# Patient Record
Sex: Female | Born: 1985 | Race: Black or African American | Hispanic: No | Marital: Married | Smoking: Current every day smoker
Health system: Southern US, Community
[De-identification: ages and names within clinical notes are randomized; demographics above are authoritative.]

## PROBLEM LIST (undated history)

## (undated) DIAGNOSIS — J189 Pneumonia, unspecified organism: Secondary | ICD-10-CM

## (undated) DIAGNOSIS — J45909 Unspecified asthma, uncomplicated: Secondary | ICD-10-CM

## (undated) DIAGNOSIS — Z9289 Personal history of other medical treatment: Secondary | ICD-10-CM

## (undated) DIAGNOSIS — D649 Anemia, unspecified: Secondary | ICD-10-CM

## (undated) HISTORY — PX: CHOLECYSTECTOMY: SHX55

## (undated) HISTORY — DX: Anemia, unspecified: D64.9

## (undated) HISTORY — PX: TONSILLECTOMY: SUR1361

---
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
## 2014-06-05 ENCOUNTER — Emergency Department (HOSPITAL_BASED_OUTPATIENT_CLINIC_OR_DEPARTMENT_OTHER): Payer: Self-pay

## 2014-06-05 ENCOUNTER — Inpatient Hospital Stay (HOSPITAL_BASED_OUTPATIENT_CLINIC_OR_DEPARTMENT_OTHER)
Admission: EM | Admit: 2014-06-05 | Discharge: 2014-06-08 | DRG: 193 | Disposition: A | Discharge status: Home / Self Care | Payer: Self-pay | Attending: Internal Medicine | Admitting: Internal Medicine

## 2014-06-05 ENCOUNTER — Encounter (HOSPITAL_BASED_OUTPATIENT_CLINIC_OR_DEPARTMENT_OTHER): Payer: Self-pay | Admitting: *Deleted

## 2014-06-05 DIAGNOSIS — R059 Cough, unspecified: Secondary | ICD-10-CM

## 2014-06-05 DIAGNOSIS — Z885 Allergy status to narcotic agent status: Secondary | ICD-10-CM

## 2014-06-05 DIAGNOSIS — J4520 Mild intermittent asthma, uncomplicated: Secondary | ICD-10-CM

## 2014-06-05 DIAGNOSIS — J189 Pneumonia, unspecified organism: Principal | ICD-10-CM | POA: Diagnosis present

## 2014-06-05 DIAGNOSIS — R0602 Shortness of breath: Secondary | ICD-10-CM

## 2014-06-05 DIAGNOSIS — R197 Diarrhea, unspecified: Secondary | ICD-10-CM | POA: Diagnosis present

## 2014-06-05 DIAGNOSIS — R05 Cough: Secondary | ICD-10-CM

## 2014-06-05 DIAGNOSIS — J9601 Acute respiratory failure with hypoxia: Secondary | ICD-10-CM | POA: Diagnosis present

## 2014-06-05 DIAGNOSIS — J45909 Unspecified asthma, uncomplicated: Secondary | ICD-10-CM | POA: Diagnosis present

## 2014-06-05 DIAGNOSIS — D509 Iron deficiency anemia, unspecified: Secondary | ICD-10-CM | POA: Diagnosis present

## 2014-06-05 DIAGNOSIS — Z6841 Body Mass Index (BMI) 40.0 and over, adult: Secondary | ICD-10-CM

## 2014-06-05 DIAGNOSIS — Z881 Allergy status to other antibiotic agents status: Secondary | ICD-10-CM

## 2014-06-05 HISTORY — DX: Unspecified asthma, uncomplicated: J45.909

## 2014-06-05 LAB — CREATININE, SERUM
Creatinine, Ser: 0.58 mg/dL (ref 0.50–1.10)
GFR calc Af Amer: >90 mL/min (ref >90)

## 2014-06-05 LAB — CBC WITH DIFFERENTIAL/PLATELET
BASOS ABS: 0.1 10*3/uL (ref 0.0–0.1)
Basophils Relative: 1 % (ref 0–1)
Eosinophils Absolute: 0 10*3/uL (ref 0.0–0.7)
Eosinophils Relative: 0 % (ref 0–5)
HCT: 30.6 % — ABNORMAL LOW (ref 36.0–46.0)
Hemoglobin: 8.2 g/dL — ABNORMAL LOW (ref 12.0–15.0)
Lymphocytes Relative: 30 % (ref 12–46)
Lymphs Abs: 1.8 10*3/uL (ref 0.7–4.0)
MCH: 18.4 pg — ABNORMAL LOW (ref 26.0–34.0)
MCHC: 26.8 g/dL — ABNORMAL LOW (ref 30.0–36.0)
MCV: 68.6 fL — ABNORMAL LOW (ref 78.0–100.0)
Monocytes Absolute: 0.5 10*3/uL (ref 0.1–1.0)
Monocytes Relative: 8 % (ref 3–12)
Neutro Abs: 3.7 10*3/uL (ref 1.7–7.7)
Neutrophils Relative %: 61 % (ref 43–77)
Platelets: 361 10*3/uL (ref 150–400)
RBC: 4.46 MIL/uL (ref 3.87–5.11)
RDW: 20.2 % — ABNORMAL HIGH (ref 11.5–15.5)
WBC: 6.1 10*3/uL (ref 4.0–10.5)

## 2014-06-05 LAB — BASIC METABOLIC PANEL
ANION GAP: 6 (ref 5–15)
BUN: 7 mg/dL (ref 6–23)
CALCIUM: 8.7 mg/dL (ref 8.4–10.5)
CO2: 27 mmol/L (ref 19–32)
Chloride: 103 mEq/L (ref 96–112)
Creatinine, Ser: 0.71 mg/dL (ref 0.50–1.10)
GFR calc Af Amer: >90 mL/min (ref >90)
GFR calc non Af Amer: >90 mL/min (ref >90)
Glucose, Bld: 118 mg/dL — ABNORMAL HIGH (ref 70–99)
Potassium: 3.6 mmol/L (ref 3.5–5.1)
Sodium: 136 mmol/L (ref 135–145)

## 2014-06-05 LAB — CBC
HCT: 30.2 % — ABNORMAL LOW (ref 36.0–46.0)
HEMOGLOBIN: 8.1 g/dL — AB (ref 12.0–15.0)
MCH: 18.3 pg — ABNORMAL LOW (ref 26.0–34.0)
MCHC: 26.8 g/dL — ABNORMAL LOW (ref 30.0–36.0)
MCV: 68.3 fL — ABNORMAL LOW (ref 78.0–100.0)
Platelets: 325 10*3/uL (ref 150–400)
RBC: 4.42 MIL/uL (ref 3.87–5.11)
RDW: 19.4 % — AB (ref 11.5–15.5)
WBC: 6.7 10*3/uL (ref 4.0–10.5)

## 2014-06-05 LAB — PREGNANCY, URINE: Preg Test, Ur: NEGATIVE

## 2014-06-05 LAB — D-DIMER, QUANTITATIVE (NOT AT ARMC): D-Dimer, Quant: 2.7 ug/mL-FEU — ABNORMAL HIGH (ref 0.00–0.48)

## 2014-06-05 MED ORDER — DEXTROSE 5 % IV SOLN
500.0000 mg | Freq: Once | INTRAVENOUS | Status: AC
  Administered 2014-06-05: 500 mg via INTRAVENOUS
  Filled 2014-06-05: qty 500

## 2014-06-05 MED ORDER — ALBUTEROL SULFATE (2.5 MG/3ML) 0.083% IN NEBU: 2.5000 mg | INHALATION_SOLUTION | Freq: Four times a day (QID) | RESPIRATORY_TRACT | Status: DC | PRN

## 2014-06-05 MED ORDER — ENOXAPARIN SODIUM 60 MG/0.6ML ~~LOC~~ SOLN
60.0000 mg | SUBCUTANEOUS | Status: DC
  Administered 2014-06-05 – 2014-06-06 (×2): 60 mg via SUBCUTANEOUS
  Filled 2014-06-05 (×4): qty 0.6

## 2014-06-05 MED ORDER — ALBUTEROL SULFATE (2.5 MG/3ML) 0.083% IN NEBU: 2.5000 mg | INHALATION_SOLUTION | RESPIRATORY_TRACT | Status: DC | PRN

## 2014-06-05 MED ORDER — ONDANSETRON HCL 4 MG/2ML IJ SOLN
4.0000 mg | Freq: Three times a day (TID) | INTRAMUSCULAR | Status: DC | PRN
  Administered 2014-06-06: 4 mg via INTRAVENOUS
  Filled 2014-06-05: qty 2

## 2014-06-05 MED ORDER — ACETAMINOPHEN 325 MG PO TABS
650.0000 mg | ORAL_TABLET | Freq: Once | ORAL | Status: AC
  Administered 2014-06-05: 650 mg via ORAL
  Filled 2014-06-05: qty 2

## 2014-06-05 MED ORDER — DEXTROSE 5 % IV SOLN: 1.0000 g | Freq: Once | INTRAVENOUS | Status: DC

## 2014-06-05 MED ORDER — CEFTRIAXONE SODIUM 1 G IJ SOLR
INTRAMUSCULAR | Status: AC
  Filled 2014-06-05: qty 10

## 2014-06-05 MED ORDER — PANTOPRAZOLE SODIUM 40 MG PO TBEC
40.0000 mg | DELAYED_RELEASE_TABLET | Freq: Every day | ORAL | Status: DC
  Administered 2014-06-05 – 2014-06-08 (×4): 40 mg via ORAL
  Filled 2014-06-05 (×5): qty 1

## 2014-06-05 MED ORDER — LEVOFLOXACIN IN D5W 750 MG/150ML IV SOLN
750.0000 mg | INTRAVENOUS | Status: DC
  Administered 2014-06-05 – 2014-06-07 (×3): 750 mg via INTRAVENOUS
  Filled 2014-06-05 (×3): qty 150

## 2014-06-05 MED ORDER — ACETAMINOPHEN 325 MG PO TABS
650.0000 mg | ORAL_TABLET | Freq: Four times a day (QID) | ORAL | Status: DC | PRN
  Administered 2014-06-05 – 2014-06-08 (×2): 650 mg via ORAL
  Filled 2014-06-05 (×2): qty 2

## 2014-06-05 MED ORDER — ALBUTEROL SULFATE HFA 108 (90 BASE) MCG/ACT IN AERS: 2.0000 | INHALATION_SPRAY | Freq: Four times a day (QID) | RESPIRATORY_TRACT | Status: DC | PRN

## 2014-06-05 NOTE — H&P (Signed)
[facility] History and Physical  Stacy Harrington:[id number] DOB: [birth year] DOA: 06/05/2014  Referring physician: ER physician. Patient was transferred from [facility] [place]. PCP: No primary care provider on file.   Chief Complaint: Shortness of breath.  HPI: Stacy Harrington is a 29 y.o. female with history of asthma and chronic pain deficiency anemia presents to the ER because of shortness of breath and productive cough. Patient also has been having diarrhea for last 1 day. Patient states she has been having productive cough and shortness of breath sometimes exertional. Denies any chest pain. In the ER chest x-ray shows features consistent with multilobar pneumonia. Patient has been admitted for further management. Patient denies any recent travel or sick contacts. Patient denies having taken any recent antibiotics. Diarrhea is watery denies any abdominal pain.   Review of Systems: As presented in the history of presenting illness, rest negative.  Past Medical History  Diagnosis Date  . Asthma    Past Surgical History  Procedure Laterality Date  . Tonsillectomy    . Cholecystectomy     Social History:  reports that she has never smoked. She does not have any smokeless tobacco history on file. She reports that she does not drink alcohol. Her drug history is not on file. Where does patient live home. Can patient participate in ADLs? Yes.  Allergies  Allergen Reactions  . Aloe Vera Hives  . Ceclor [Cefaclor] Hives    Per mother when she was young  . Codeine     Hallucinations.      Family History:  Family History  Problem Relation Age of Onset  . Asthma Mother   . Asthma Father   . Diabetes Mellitus II Father       Prior to Admission medications   Medication Sig Start Date End Date Taking? Authorizing Provider  Acetaminophen (TYLENOL PO) Take by mouth.   Yes Historical Provider, MD  albuterol (PROVENTIL HFA;VENTOLIN HFA) 108 (90 BASE) MCG/ACT inhaler Inhale 2  puffs into the lungs every 6 (six) hours as needed for wheezing or shortness of breath.   Yes Historical Provider, MD  Chlorphen-Pseudoephed-APAP (THERAFLU FLU/COLD PO) Take by mouth.   Yes Historical Provider, MD  IBUPROFEN PO Take by mouth.   Yes Historical Provider, MD  omeprazole (PRILOSEC) 20 MG capsule Take 20 mg by mouth daily.   Yes Historical Provider, MD    Physical Exam: Filed Vitals:   06/05/14 1539 06/05/14 1748 06/05/14 1907  BP: 155/94 146/77 135/83  Pulse: 121 102 101  Temp: 100 F (37.8 C)  99.6 F (37.6 C)  TempSrc: Oral  Oral  Resp: Height:  (1.626 m)    Weight: 136.079 kg (300 lb)    SpO2: 97% 97% 98%     General:  Obese not in distress.  Eyes: Anicteric no pallor.  ENT: No discharge from the ears eyes nose or mouth.  Neck: No mass felt. No JVD appreciated.  Cardiovascular: S1 and S2 heard.  Respiratory: No rhonchi or crepitations.  Abdomen: Soft nontender bowel sounds present.  Skin: No rash.  Musculoskeletal: No edema.  Psychiatric: Appears normal.  Neurologic: Alert oriented to time place and person. Moves all extremities.  Labs on Admission:  Basic Metabolic Panel:  Recent Labs Lab 06/05/14 1630  NA 136  K 3.6  CL 103  CO2 27  GLUCOSE 118*  BUN 7  CREATININE 0.71  CALCIUM 8.7   Liver Function Tests: No results for input(s):  AST, ALT, ALKPHOS, BILITOT, PROT, ALBUMIN in the last 168 hours. No results for input(s): LIPASE, AMYLASE in the last 168 hours. No results for input(s): AMMONIA in the last 168 hours. CBC:  Recent Labs Lab 06/05/14 1630  WBC 6.1  NEUTROABS 3.7  HGB 8.2*  HCT 30.6*  MCV 68.6*  PLT 361   Cardiac Enzymes: No results for input(s): CKTOTAL, CKMB, CKMBINDEX, TROPONINI in the last 168 hours.  BNP (last 3 results) No results for input(s): PROBNP in the last 8760 hours. CBG: No results for input(s): GLUCAP in the last 168 hours.  Radiological Exams on Admission: Dg Chest 2  View  06/05/2014   CLINICAL DATA:  Cough. Congestion. Shortness of breath. Duration: Several days.  EXAM: CHEST  2 VIEW  COMPARISON:  None.  FINDINGS: Body habitus reduces diagnostic sensitivity and specificity. Right lower lobe consolidation favors pneumonia. Faint left perihilar airspace opacity.  Azygos fissure noted. Borderline enlargement of the cardiopericardial silhouette  IMPRESSION: 1. Right lower lobe airspace opacity favoring pneumonia. There is also faint left perihilar airspace opacity, and accordingly a multilobar pneumonia cannot be excluded. 2. Borderline enlargement of the cardiopericardial silhouette.   Electronically Signed   By: Herbie Baltimore M.D.   On: 06/05/2014 16:59     Assessment/Plan Principal Problem:   CAP (community acquired pneumonia) Active Problems:   Bronchial asthma   Anemia, iron deficiency   1. Pneumonia - patient has been placed on Levaquin for community-acquired pneumonia. Check influenza PCR, urine for strep and legionella antigen. Since patient has some exertional symptoms we'll check d-dimer and BNP. If d-dimer is elevated we will get CT angiogram of the chest. 2. Diarrhea - may be related to pneumonia but check stool studies. 3. Chronic macrocytic hypochromic iron deficiency anemia - follow CBC and iron replacement. 4. Bronchial asthma - presently not actively wheezing. Continue when necessary albuterol.   DVT Prophylaxis Lovenox.  Code Status: Full code.  Family Communication: Patient's mother.  Disposition Plan: Admit to inpatient.    Daune Divirgilio N. [facility] Pager [phone].  If 7PM-7AM, please contact night-coverage [email or web address] Password [facility] 06/05/2014, 9:19 PM

## 2014-06-05 NOTE — ED Notes (Signed)
Patient having cough, congestio, N/V/D, weakness and fatigue

## 2014-06-05 NOTE — Progress Notes (Signed)
ANTIBIOTIC CONSULT NOTE - INITIAL  Pharmacy Consult for Levaquin Indication: pneumonia  Allergies  Allergen Reactions  . Aloe Vera Hives  . Ceclor [Cefaclor] Hives    Per mother when she was young  . Codeine     Hallucinations.      Patient Measurements: Height:  (162.6 cm) Weight: 300 lb (136.079 kg) IBW/kg (Calculated) : 54.7  Vital Signs: Temp: 101.4 F (38.6 C) (01/03 2129) Temp Source: Oral (01/03 2129) BP: 146/81 mmHg (01/03 2129) Pulse Rate: 94 (01/03 2129) Intake/Output from previous day:   Intake/Output from this shift: Total I/O In: -  Out: 300 [Urine:300]  Labs:  Recent Labs  06/05/14 1630  WBC 6.1  HGB 8.2*  PLT 361  CREATININE 0.71   Estimated Creatinine Clearance: 141.7 mL/min (by C-G formula based on Cr of 0.71). No results for input(s): VANCOTROUGH, VANCOPEAK, VANCORANDOM, GENTTROUGH, GENTPEAK, GENTRANDOM, TOBRATROUGH, TOBRAPEAK, TOBRARND, AMIKACINPEAK, AMIKACINTROU, AMIKACIN in the last 72 hours.   Microbiology: No results found for this or any previous visit (from the past 720 hour(s)).  Medical History: Past Medical History  Diagnosis Date  . Asthma     Medications:  Scheduled:  . enoxaparin (LOVENOX) injection  60 mg Subcutaneous Q24H  . levofloxacin (LEVAQUIN) IV  750 mg Intravenous Q24H  . pantoprazole  40 mg Oral Daily   Infusions:   Assessment:  28 yr female with cough, congestion, weakness, fatigue, N/V.  H/O asthma  Chest Xray shows favoring PNA in right lower lobe. Cannot exclude multilobar PNA  Blood and sputum cultures ordered  Patient received Azithromycin  IV x 1 in ED @ 18:05  Pharmacy consulted to begin Levaquin   Pregnancy test resulted negative  Goal of Therapy:  Eradication of infection  Plan:  Levaquin  IV q24h F/U culture results and sensitivities  Ronella Plunk, Joselyn Glassman, PharmD 06/05/2014,10:07 PM

## 2014-06-05 NOTE — ED Provider Notes (Signed)
CSN: [id number]     Arrival date & time 06/05/14  1531 History  This chart was scribed for Nelia Shi, MD by Milly Jakob, ED Scribe. The patient was seen in room MH03/MH03. Patient's care was started at 4:20 PM.   Chief Complaint  Patient presents with  . Influenza   The history is provided by the patient. No language interpreter was used.   HPI Comments: Stacy Harrington is a 29 y.o. female who presents to the Emergency Department complaining of constant, flu-like symptoms for the past 4 days. She reports SOB, chills, fever, diarrhea, persistent cough, and rhinorrhea. She states that the rhinorrhea has cleared up and everything else has become gradually worse. She reports a history of Asthma, and states that she used her inhaler at home twice today without relief. She denies vomiting. She denies getting a flu shot this year.   Past Medical History  Diagnosis Date  . Asthma     uses inhaler  . Anemia    Past Surgical History  Procedure Laterality Date  . Tonsillectomy    . Cholecystectomy     Family History  Problem Relation Age of Onset  . Asthma Mother   . Hypertension Mother   . Asthma Father   . Diabetes Mellitus II Father   . Cancer Father    History  Substance Use Topics  . Smoking status: Never Smoker   . Smokeless tobacco: Not on file  . Alcohol Use: No   OB History    No data available     Review of Systems  Constitutional: Positive for fever and chills.  HENT: Positive for rhinorrhea.   Respiratory: Positive for cough, shortness of breath and wheezing.   Gastrointestinal: Positive for diarrhea. Negative for nausea and vomiting.  All other systems reviewed and are negative.  Allergies  Aloe vera; Ceclor; and Codeine  Home Medications   Prior to Admission medications   Medication Sig Start Date End Date Taking? Authorizing Provider  Acetaminophen (TYLENOL PO) Take by mouth.   Yes Historical Provider, MD  Chlorphen-Pseudoephed-APAP (THERAFLU  FLU/COLD PO) Take by mouth.   Yes Historical Provider, MD  IBUPROFEN PO Take by mouth.   Yes Historical Provider, MD  omeprazole (PRILOSEC) 20 MG capsule Take 20 mg by mouth daily.   Yes Historical Provider, MD  albuterol (PROVENTIL HFA;VENTOLIN HFA) 108 (90 BASE) MCG/ACT inhaler Inhale 2 puffs into the lungs every 6 (six) hours as needed for wheezing or shortness of breath. 06/10/14   Ambrose Finland, NP  albuterol (PROVENTIL) (2.5 MG/3ML) 0.083% nebulizer solution Take 3 mLs (2.5 mg total) by nebulization every 4 (four) hours as needed for wheezing or shortness of breath. 06/08/14   Dorothea Ogle, MD  ferrous sulfate 300 (60 FE) MG/5ML syrup Take 5 mLs (300 mg total) by mouth 3 (three) times daily with meals. 06/10/14   Ambrose Finland, NP  guaiFENesin-dextromethorphan (ROBITUSSIN DM) 100-10 MG/5ML syrup Take 5 mLs by mouth every 4 (four) hours as needed for cough. 06/08/14   Dorothea Ogle, MD  levofloxacin (LEVAQUIN) 750 MG tablet Take 1 tablet (750 mg total) by mouth daily. 06/08/14   Dorothea Ogle, MD  predniSONE (DELTASONE) 50 MG tablet Take one pill once per day 06/10/14   Ambrose Finland, NP   Triage Vitals: BP 155/94 mmHg  Pulse 121  Temp(Src) 100 F (37.8 C) (Oral)  Resp 24  Ht 5\' 4"  (1.626 m)  Wt 300 lb (136.079 kg)  BMI 51.47 kg/m2  SpO2 97% Physical Exam Physical Exam  Nursing note and vitals reviewed. Constitutional: He is oriented to person, place, and time. He appears well-developed and well-nourished. No distress.  HENT:  Head: Normocephalic and atraumatic.  Eyes: Pupils are equal, round, and reactive to light.  Neck: Normal range of motion.  Cardiovascular: Normal rate and intact distal pulses.   Pulmonary/Chest: No respiratory distress.  Abdominal: Normal appearance. He exhibits no distension.  Musculoskeletal: Normal range of motion.  Neurological: He is alert and oriented to person, place, and time. No cranial nerve deficit.  Skin: Skin is warm and dry. No rash noted.   Psychiatric: He has a normal mood and affect. His behavior is normal.   ED Course  Procedures (including critical care time)  Medications  acetaminophen (TYLENOL) tablet 650 mg (650 mg Oral Given 06/05/14 1634)  azithromycin (ZITHROMAX) 500 mg in dextrose 5 % 250 mL IVPB (500 mg Intravenous New Bag/Given 06/05/14 1805)  cefTRIAXone (ROCEPHIN) 1 G injection (  Duplicate 06/05/14 1818)  iohexol (OMNIPAQUE) 350 MG/ML injection 100 mL (100 mLs Intravenous Contrast Given 06/06/14 0739)    DIAGNOSTIC STUDIES: Oxygen Saturation is 97% on room air, normal by my interpretation.    COORDINATION OF CARE: 4:25 PM-Discussed treatment plan which includes lab work, CXR, and rest from work with pt at bedside and pt agreed to plan.   Labs Review Labs Reviewed  BASIC METABOLIC PANEL - Abnormal; Notable for the following:    Glucose, Bld 118 (*)    All other components within normal limits  CBC WITH DIFFERENTIAL - Abnormal; Notable for the following:    Hemoglobin 8.2 (*)    HCT 30.6 (*)    MCV 68.6 (*)    MCH 18.4 (*)    MCHC 26.8 (*)    RDW 20.2 (*)    All other components within normal limits  D-DIMER, QUANTITATIVE - Abnormal; Notable for the following:    D-Dimer, Quant 2.70 (*)    All other components within normal limits  COMPREHENSIVE METABOLIC PANEL - Abnormal; Notable for the following:    Glucose, Bld 102 (*)    Albumin 3.2 (*)    All other components within normal limits  CBC WITH DIFFERENTIAL - Abnormal; Notable for the following:    Hemoglobin 7.9 (*)    HCT 29.2 (*)    MCV 68.5 (*)    MCH 18.5 (*)    MCHC 27.1 (*)    RDW 19.6 (*)    All other components within normal limits  CBC - Abnormal; Notable for the following:    Hemoglobin 8.1 (*)    HCT 30.2 (*)    MCV 68.3 (*)    MCH 18.3 (*)    MCHC 26.8 (*)    RDW 19.4 (*)    All other components within normal limits  CBC - Abnormal; Notable for the following:    Hemoglobin 7.7 (*)    HCT 29.2 (*)    MCV 69.0 (*)    MCH 18.2  (*)    MCHC 26.4 (*)    RDW 19.6 (*)    All other components within normal limits  CBC - Abnormal; Notable for the following:    Hemoglobin 7.5 (*)    HCT 28.6 (*)    MCV 70.1 (*)    MCH 18.4 (*)    MCHC 26.2 (*)    RDW 19.9 (*)    All other components within normal limits  BASIC METABOLIC PANEL -  Abnormal; Notable for the following:    Glucose, Bld 102 (*)    Calcium 8.3 (*)    All other components within normal limits  URINALYSIS, ROUTINE W REFLEX MICROSCOPIC - Abnormal; Notable for the following:    APPearance HAZY (*)    Specific Gravity, Urine 1.034 (*)    All other components within normal limits  CULTURE, BLOOD (ROUTINE X 2)  CULTURE, BLOOD (ROUTINE X 2)  CULTURE, EXPECTORATED SPUTUM-ASSESSMENT  STOOL CULTURE  CLOSTRIDIUM DIFFICILE BY PCR  URINE CULTURE  INFLUENZA PANEL BY PCR (TYPE A & B, H1N1)  BRAIN NATRIURETIC PEPTIDE  HIV ANTIBODY (ROUTINE TESTING)  LEGIONELLA ANTIGEN, URINE  STREP PNEUMONIAE URINARY ANTIGEN  CREATININE, SERUM  PREGNANCY, URINE  BASIC METABOLIC PANEL    Imaging Review IMPRESSION: Multifocal pneumonia is noted, most prominently seen in the right lower lobe.  There appears to be no definite evidence of large central pulmonary embolus. However, this exam is significantly limited due to attenuation to body habitus as well as respiratory motion artifact, and therefore smaller peripheral pulmonary emboli cannot be excluded on the basis of this exam.   Electronically Signed By: Roque Lias M.D. On: 06/06/2014 08:16          DG Chest 2 View (Final result) Result time: 06/05/14 16:59:04   Final result by Rad Results In Interface (06/05/14 16:59:04)   Narrative:   CLINICAL DATA: Cough. Congestion. Shortness of breath. Duration: Several days.  EXAM: CHEST 2 VIEW  COMPARISON: None.  FINDINGS: Body habitus reduces diagnostic sensitivity and specificity. Right lower lobe consolidation favors pneumonia. Faint left  perihilar airspace opacity.  Azygos fissure noted. Borderline enlargement of the cardiopericardial silhouette  IMPRESSION: 1. Right lower lobe airspace opacity favoring pneumonia. There is also faint left perihilar airspace opacity, and accordingly a multilobar pneumonia cannot be excluded. 2. Borderline enlargement of the cardiopericardial silhouette.   Electronically Signed By: Herbie Baltimore M.D. On: 06/05/2014 16:59       MDM   Final diagnoses:  Cough  CAP (community acquired pneumonia)    I personally performed the services described in this documentation, which was scribed in my presence. The recorded information has been reviewed and considered.   Nelia Shi, MD 06/17/14 [phone]

## 2014-06-06 ENCOUNTER — Encounter (HOSPITAL_COMMUNITY): Payer: Self-pay

## 2014-06-06 ENCOUNTER — Inpatient Hospital Stay (HOSPITAL_COMMUNITY): Payer: Self-pay

## 2014-06-06 LAB — CBC WITH DIFFERENTIAL/PLATELET
Basophils Absolute: 0 10*3/uL (ref 0.0–0.1)
Basophils Relative: 0 % (ref 0–1)
EOS ABS: 0 10*3/uL (ref 0.0–0.7)
Eosinophils Relative: 0 % (ref 0–5)
HCT: 29.2 % — ABNORMAL LOW (ref 36.0–46.0)
Hemoglobin: 7.9 g/dL — ABNORMAL LOW (ref 12.0–15.0)
LYMPHS PCT: 37 % (ref 12–46)
Lymphs Abs: 2.4 10*3/uL (ref 0.7–4.0)
MCH: 18.5 pg — ABNORMAL LOW (ref 26.0–34.0)
MCHC: 27.1 g/dL — ABNORMAL LOW (ref 30.0–36.0)
MCV: 68.5 fL — ABNORMAL LOW (ref 78.0–100.0)
MONOS PCT: 8 % (ref 3–12)
Monocytes Absolute: 0.5 10*3/uL (ref 0.1–1.0)
Neutro Abs: 3.5 10*3/uL (ref 1.7–7.7)
Neutrophils Relative %: 55 % (ref 43–77)
Platelets: 323 10*3/uL (ref 150–400)
RBC: 4.26 MIL/uL (ref 3.87–5.11)
RDW: 19.6 % — ABNORMAL HIGH (ref 11.5–15.5)
WBC: 6.4 10*3/uL (ref 4.0–10.5)

## 2014-06-06 LAB — INFLUENZA PANEL BY PCR (TYPE A & B)
H1N1 flu by pcr: NOT DETECTED
Influenza A By PCR: NEGATIVE
Influenza B By PCR: NEGATIVE

## 2014-06-06 LAB — COMPREHENSIVE METABOLIC PANEL
ALK PHOS: 72 U/L (ref 39–117)
ALT: 15 U/L (ref 0–35)
AST: 26 U/L (ref 0–37)
Albumin: 3.2 g/dL — ABNORMAL LOW (ref 3.5–5.2)
Anion gap: 7 (ref 5–15)
BUN: 7 mg/dL (ref 6–23)
CALCIUM: 8.4 mg/dL (ref 8.4–10.5)
CO2: 26 mmol/L (ref 19–32)
Chloride: 102 mEq/L (ref 96–112)
Creatinine, Ser: 0.74 mg/dL (ref 0.50–1.10)
GFR calc Af Amer: >90 mL/min (ref >90)
GFR calc non Af Amer: >90 mL/min (ref >90)
Glucose, Bld: 102 mg/dL — ABNORMAL HIGH (ref 70–99)
Potassium: 3.5 mmol/L (ref 3.5–5.1)
SODIUM: 135 mmol/L (ref 135–145)
TOTAL PROTEIN: 7.6 g/dL (ref 6.0–8.3)
Total Bilirubin: 0.5 mg/dL (ref 0.3–1.2)

## 2014-06-06 LAB — EXPECTORATED SPUTUM ASSESSMENT W REFEX TO RESP CULTURE

## 2014-06-06 LAB — BRAIN NATRIURETIC PEPTIDE: B NATRIURETIC PEPTIDE 5: 35.9 pg/mL (ref 0.0–100.0)

## 2014-06-06 LAB — EXPECTORATED SPUTUM ASSESSMENT W GRAM STAIN, RFLX TO RESP C

## 2014-06-06 LAB — HIV ANTIBODY (ROUTINE TESTING W REFLEX): HIV 1&2 Ab, 4th Generation: NONREACTIVE

## 2014-06-06 LAB — STREP PNEUMONIAE URINARY ANTIGEN: Strep Pneumo Urinary Antigen: NEGATIVE

## 2014-06-06 MED ORDER — SODIUM CHLORIDE 0.9 % IV SOLN
INTRAVENOUS | Status: DC
  Administered 2014-06-06: 10:00:00 via INTRAVENOUS

## 2014-06-06 MED ORDER — GUAIFENESIN ER 600 MG PO TB12
600.0000 mg | ORAL_TABLET | Freq: Two times a day (BID) | ORAL | Status: DC
  Administered 2014-06-06 – 2014-06-08 (×4): 600 mg via ORAL
  Filled 2014-06-06 (×7): qty 1

## 2014-06-06 MED ORDER — ALBUTEROL SULFATE (2.5 MG/3ML) 0.083% IN NEBU: 2.5000 mg | INHALATION_SOLUTION | RESPIRATORY_TRACT | Status: DC | PRN

## 2014-06-06 MED ORDER — IPRATROPIUM-ALBUTEROL 0.5-2.5 (3) MG/3ML IN SOLN
3.0000 mL | Freq: Four times a day (QID) | RESPIRATORY_TRACT | Status: DC
  Administered 2014-06-06 – 2014-06-07 (×4): 3 mL via RESPIRATORY_TRACT
  Filled 2014-06-06 (×4): qty 3

## 2014-06-06 MED ORDER — IOHEXOL 350 MG/ML SOLN
100.0000 mL | Freq: Once | INTRAVENOUS | Status: AC | PRN
  Administered 2014-06-06: 100 mL via INTRAVENOUS

## 2014-06-06 MED ORDER — GUAIFENESIN-DM 100-10 MG/5ML PO SYRP: 5.0000 mL | ORAL_SOLUTION | ORAL | Status: DC | PRN

## 2014-06-06 NOTE — Progress Notes (Signed)
Patient ID: Stacy Harrington, female   DOB: [birth year], 29 y.o.   MRN: [id number]  [facility] PROGRESS NOTE  LAIA WILEY WUJ:[id number] DOB: [birth year] DOA: 06/05/2014 PCP: No primary care provider on file.  Brief narrative: 29 y.o. female with history of asthma and chronic iron deficiency anemia, presented to the ER because of shortness of breath and productive cough.  In the ER chest x-ray consistent with multilobar pneumonia. Patient has been admitted for further management. Patient denies any recent travel or sick contacts. Patient denies having taken any recent antibiotics.  Assessment and Plan:    Principal Problem:   Acute hypoxic respiratory failure - secondary to multilobar PNA, imposed on chronic respiratory failure from asthma  - clinically stable this AM but still whit wheezing and Tmax 101.4 F - continue Levaquin day #2 - place on BD's scheduled and as needed - check oxygen at rest and with ambulation  - anti tussives can be provided if needed  Active Problems:   Anemia, iron deficiency - chronic, no signs of active bleeding - repeat CBC in AM   Morbid obesity, BMI > 40 - ambulate   DVT prophylaxis  Lovenox SQ while pt is in hospital  Code Status: Full Family Communication: Pt at bedside Disposition Plan: Home when medically stable  IV Access:   Peripheral IV Procedures and diagnostic studies:   Dg Chest 2 View  06/05/2014  Right lower lobe airspace opacity favoring pneumonia. There is also faint left perihilar airspace opacity, and accordingly a multilobar pneumonia cannot be excluded. Borderline enlargement of the cardiopericardial silhouette. Ct Angio Chest Pe W/cm &/or Wo Cm  06/06/2014  Multifocal pneumonia is noted, most prominently seen in the right lower lobe.  There appears to be no definite evidence of large central pulmonary embolus. However, this exam is significantly limited due to attenuation to body habitus as well as respiratory motion artifact, and  therefore smaller peripheral pulmonary emboli cannot be excluded on the basis of this exam.   Medical Consultants:   None  Other Consultants:   None   Anti-Infectives:   Levaquin 1/3 -->  Debbora Presto, MD  [facility] Pager [phone]  If 7PM-7AM, please contact night-coverage [email or web address] Password [facility] 06/06/2014, 9:51 AM   LOS: 1 day   HPI/Subjective: No events overnight.   Objective: Filed Vitals:   06/05/14 1907 06/05/14 2129 06/06/14 0029 06/06/14 0611  BP: 135/83 146/81  139/72  Pulse: 101 94  96  Temp: 99.6 F (37.6 C) 101.4 F (38.6 C) 99.5 F (37.5 C) 99.2 F (37.3 C)  TempSrc: Oral Oral Oral Oral  Resp: Height:      Weight:      SpO2: 98% 99%  98%    Intake/Output Summary (Last 24 hours) at 06/06/14 0951 Last data filed at 06/06/14 0612  Gross per 24 hour  Intake      0 ml  Output    850 ml  Net   -850 ml    Exam:   General:  Pt is alert, follows commands appropriately, not in acute distress. Morbidly obese   Cardiovascular: Regular rate and rhythm, S1/S2, no murmurs, no rubs, no gallops  Respiratory: Clear to auscultation bilaterally, mild exp wheezing, diminished breath sounds at bases   Abdomen: Soft, non tender, non distended, bowel sounds present, no guarding   Data Reviewed: Basic Metabolic Panel:  Recent Labs Lab 06/05/14 1630 06/05/14 2200 06/06/14 0450  NA 136  --  135  K  3.6  --  3.5  CL 103  --  102  CO2 27  --  26  GLUCOSE 118*  --  102*  BUN 7  --  7  CREATININE 0.71 0.58 0.74  CALCIUM 8.7  --  8.4   Liver Function Tests:  Recent Labs Lab 06/06/14 0450  AST 26  ALT 15  ALKPHOS 72  BILITOT 0.5  PROT 7.6  ALBUMIN 3.2*   CBC:  Recent Labs Lab 06/05/14 1630 06/05/14 2200 06/06/14 0450  WBC 6.1 6.7 6.4  NEUTROABS 3.7  --  3.5  HGB 8.2* 8.1* 7.9*  HCT 30.6* 30.2* 29.2*  MCV 68.6* 68.3* 68.5*  PLT 361 325 323    Recent Results (from the past 240 hour(s))  Culture, sputum-assessment      Status: None   Collection Time: 06/06/14  4:19 AM  Result Value Ref Range Status   Specimen Description SPUTUM  Final   Special Requests NONE  Final   Sputum evaluation   Final    MICROSCOPIC FINDINGS SUGGEST THAT THIS SPECIMEN IS NOT REPRESENTATIVE OF LOWER RESPIRATORY SECRETIONS. PLEASE RECOLLECT. NOTIFIED V.JACKSON,RN AT 0443 ON 06/06/14 BY [email or web address]    Report Status 06/06/2014 FINAL  Final     Scheduled Meds: . enoxaparin (LOVENOX) injection  60 mg Subcutaneous Q24H  . ipratropium-albuterol  3 mL Nebulization QID  . levofloxacin (LEVAQUIN) IV  750 mg Intravenous Q24H  . pantoprazole  40 mg Oral Daily   Continuous Infusions: . sodium chloride 100 mL/hr at 06/06/14 0500

## 2014-06-06 NOTE — Progress Notes (Signed)
CARE MANAGEMENT NOTE 06/06/2014  Patient:  Stacy Harrington, Stacy Harrington   Account Number:  [id number]  Date Initiated:  06/06/2014  Documentation initiated by:  Ferdinand Cava  Subjective/Objective Assessment:   29 yo female admitted with CAP from home with spouse     Action/Plan:   discharge planning   Anticipated DC Date:  06/08/2014   Anticipated DC Plan:  HOME/SELF CARE      DC Planning Services  CM consult      Choice offered to / List presented to:             Status of service:  Completed, signed off Medicare Important Message given?   (If response is "NO", the following Medicare IM given date fields will be blank) Date Medicare IM given:   Medicare IM given by:   Date Additional Medicare IM given:   Additional Medicare IM given by:    Discharge Disposition:    Per UR Regulation:    If discussed at Long Length of Stay Meetings, dates discussed:    Comments:  06/06/14 Ferdinand Cava RN BSn CM [phone] Patient confirmed no PCP or insurance. Provided patient with [facility] pamphlet for foolow up care and also provided 5 page [facility] self pay guide.

## 2014-06-06 NOTE — Progress Notes (Signed)
UR complete 

## 2014-06-07 LAB — CBC
HCT: 29.2 % — ABNORMAL LOW (ref 36.0–46.0)
Hemoglobin: 7.7 g/dL — ABNORMAL LOW (ref 12.0–15.0)
MCH: 18.2 pg — ABNORMAL LOW (ref 26.0–34.0)
MCHC: 26.4 g/dL — ABNORMAL LOW (ref 30.0–36.0)
MCV: 69 fL — ABNORMAL LOW (ref 78.0–100.0)
PLATELETS: 300 10*3/uL (ref 150–400)
RBC: 4.23 MIL/uL (ref 3.87–5.11)
RDW: 19.6 % — AB (ref 11.5–15.5)
WBC: 6.8 10*3/uL (ref 4.0–10.5)

## 2014-06-07 LAB — LEGIONELLA ANTIGEN, URINE

## 2014-06-07 LAB — BASIC METABOLIC PANEL
Anion gap: 8 (ref 5–15)
BUN: 8 mg/dL (ref 6–23)
CHLORIDE: 103 meq/L (ref 96–112)
CO2: 24 mmol/L (ref 19–32)
Calcium: 8.5 mg/dL (ref 8.4–10.5)
Creatinine, Ser: 0.58 mg/dL (ref 0.50–1.10)
GFR calc Af Amer: >90 mL/min (ref >90)
GLUCOSE: 93 mg/dL (ref 70–99)
POTASSIUM: 4.2 mmol/L (ref 3.5–5.1)
Sodium: 135 mmol/L (ref 135–145)

## 2014-06-07 MED ORDER — IPRATROPIUM-ALBUTEROL 0.5-2.5 (3) MG/3ML IN SOLN
3.0000 mL | Freq: Three times a day (TID) | RESPIRATORY_TRACT | Status: DC
  Administered 2014-06-07 – 2014-06-08 (×4): 3 mL via RESPIRATORY_TRACT
  Filled 2014-06-07 (×4): qty 3

## 2014-06-07 NOTE — Progress Notes (Addendum)
Patient ID: Stacy Harrington, female   DOB: [birth year], 29 y.o.   MRN: [id number]  [facility] PROGRESS NOTE  Stacy Harrington WUJ:[id number] DOB: [birth year] DOA: 06/05/2014 PCP: No primary care provider on file.   Brief narrative: 29 y.o. female with history of asthma and chronic iron deficiency anemia, presented to the ER because of shortness of breath and productive cough. In the ER chest x-ray consistent with multilobar pneumonia. Patient has been admitted for further management. Patient denies any recent travel or sick contacts. Patient denies having taken any recent antibiotics.  Assessment and Plan:    Principal Problem:  Acute hypoxic respiratory failure - secondary to multilobar PNA, imposed on chronic respiratory failure from asthma  - clinically stable this AM but still whit wheezing and Tmax 99.16F - continue Levaquin day #3/7 - continue BD's scheduled and as needed - anti tussives can be provided if needed  Active Problems:  Anemia, iron deficiency - chronic, no signs of active bleeding - repeat CBC in AM  Morbid obesity, BMI > 40 - ambulate    Diarrhea - no stool studies obtained as diarrhea now resolved   DVT prophylaxis  Lovenox SQ provided but will change to SCD's 1/5 as Hg still on low side   Code Status: Full Family Communication: Pt at bedside Disposition Plan: Home when medically stable  IV Access:    Peripheral IV Procedures and diagnostic studies:    Dg Chest 2 View 06/05/2014 Right lower lobe airspace opacity favoring pneumonia. There is also faint left perihilar airspace opacity, and accordingly a multilobar pneumonia cannot be excluded. Borderline enlargement of the cardiopericardial silhouette.  Ct Angio Chest Pe W/cm &/or Wo Cm 06/06/2014 Multifocal pneumonia is noted, most prominently seen in the right lower lobe. There appears to be no definite evidence of large central pulmonary embolus. However, this exam is significantly limited  due to attenuation to body habitus as well as respiratory motion artifact, and therefore smaller peripheral pulmonary emboli cannot be excluded on the basis of this exam.  Medical Consultants:    None  Other Consultants:    None  Anti-Infectives:    Levaquin 1/3 --> 1/9   Debbora Presto, MD  [facility] Pager [phone]  If 7PM-7AM, please contact night-coverage [email or web address] Password [facility] 06/07/2014, 11:44 AM   LOS: 2 days   HPI/Subjective: No events overnight.   Objective: Filed Vitals:   06/06/14 1634 06/06/14 2105 06/07/14 0541 06/07/14 0755  BP:  136/83 123/64   Pulse:  106 90   Temp:  98.4 F (36.9 C) 98.4 F (36.9 C)   TempSrc:  Oral Oral   Resp:  20 16   Height:      Weight:      SpO2: 93% 96% 100% 98%    Intake/Output Summary (Last 24 hours) at 06/07/14 1144 Last data filed at 06/07/14 0900  Gross per 24 hour  Intake   2075 ml  Output    350 ml  Net   1725 ml    Exam:   General:  Pt is alert, follows commands appropriately, not in acute distress  Cardiovascular: Regular rate and rhythm, S1/S2, no murmurs, no rubs, no gallops  Respiratory: Bilateral rhonchi mostly at bases with expiratory wheezing   Abdomen: Soft, non tender, non distended, bowel sounds present, no guarding  Extremities: No edema, pulses DP and PT palpable bilaterally  Neuro: Grossly nonfocal  Data Reviewed: Basic Metabolic Panel:  Recent Labs Lab 06/05/14 1630 06/05/14 2200 06/06/14 0450 06/07/14 8657  NA 136  --  135 135  K 3.6  --  3.5 4.2  CL 103  --  102 103  CO2 27  --  26 24  GLUCOSE 118*  --  102* 93  BUN 7  --  7 8  CREATININE 0.71 0.58 0.74 0.58  CALCIUM 8.7  --  8.4 8.5   Liver Function Tests:  Recent Labs Lab 06/06/14 0450  AST 26  ALT 15  ALKPHOS 72  BILITOT 0.5  PROT 7.6  ALBUMIN 3.2*   CBC:  Recent Labs Lab 06/05/14 1630 06/05/14 2200 06/06/14 0450 06/07/14 0504  WBC 6.1 6.7 6.4 6.8  NEUTROABS 3.7  --  3.5  --   HGB 8.2*  8.1* 7.9* 7.7*  HCT 30.6* 30.2* 29.2* 29.2*  MCV 68.6* 68.3* 68.5* 69.0*  PLT 361 325 323 300   Recent Results (from the past 240 hour(s))  Culture, blood (routine x 2) Call MD if unable to obtain prior to antibiotics being given     Status: None (Preliminary result)   Collection Time: 06/05/14  9:17 PM  Result Value Ref Range Status   Specimen Description BLOOD LEFT HAND  Final   Special Requests BOTTLES DRAWN AEROBIC ONLY 10CC  Final   Culture   Final           BLOOD CULTURE RECEIVED NO GROWTH TO DATE CULTURE WILL BE HELD FOR 5 DAYS BEFORE ISSUING A FINAL NEGATIVE REPORT Performed at [facility]    Report Status PENDING  Incomplete  Culture, blood (routine x 2) Call MD if unable to obtain prior to antibiotics being given     Status: None (Preliminary result)   Collection Time: 06/05/14  9:22 PM  Result Value Ref Range Status   Specimen Description BLOOD LEFT ARM  Final   Special Requests BOTTLES DRAWN AEROBIC AND ANAEROBIC 8CC  Final   Culture   Final           BLOOD CULTURE RECEIVED NO GROWTH TO DATE CULTURE WILL BE HELD FOR 5 DAYS BEFORE ISSUING A FINAL NEGATIVE REPORT Performed at [facility]    Report Status PENDING  Incomplete  Culture, sputum-assessment     Status: None   Collection Time: 06/06/14  4:19 AM  Result Value Ref Range Status   Specimen Description SPUTUM  Final   Special Requests NONE  Final   Sputum evaluation   Final    MICROSCOPIC FINDINGS SUGGEST THAT THIS SPECIMEN IS NOT REPRESENTATIVE OF LOWER RESPIRATORY SECRETIONS. PLEASE RECOLLECT. NOTIFIED V.JACKSON,RN AT 0443 ON 06/06/14 BY SHEA.W    Report Status 06/06/2014 FINAL  Final     Scheduled Meds: . enoxaparin (LOVENOX) injection  60 mg Subcutaneous Q24H  . guaiFENesin  600 mg Oral BID  . ipratropium-albuterol  3 mL Nebulization TID  . levofloxacin (LEVAQUIN) IV  750 mg Intravenous Q24H  . pantoprazole  40 mg Oral Daily   Continuous Infusions:

## 2014-06-08 LAB — BASIC METABOLIC PANEL
Anion gap: 6 (ref 5–15)
BUN: 10 mg/dL (ref 6–23)
CALCIUM: 8.3 mg/dL — AB (ref 8.4–10.5)
CO2: 26 mmol/L (ref 19–32)
Chloride: 104 mEq/L (ref 96–112)
Creatinine, Ser: 0.62 mg/dL (ref 0.50–1.10)
GFR calc non Af Amer: >90 mL/min (ref >90)
GLUCOSE: 102 mg/dL — AB (ref 70–99)
Potassium: 3.9 mmol/L (ref 3.5–5.1)
Sodium: 136 mmol/L (ref 135–145)

## 2014-06-08 LAB — URINALYSIS, ROUTINE W REFLEX MICROSCOPIC
Bilirubin Urine: NEGATIVE
Glucose, UA: NEGATIVE mg/dL
HGB URINE DIPSTICK: NEGATIVE
KETONES UR: NEGATIVE mg/dL
Leukocytes, UA: NEGATIVE
NITRITE: NEGATIVE
PH: 6.5 (ref 5.0–8.0)
PROTEIN: NEGATIVE mg/dL
Specific Gravity, Urine: 1.034 — ABNORMAL HIGH (ref 1.005–1.030)
UROBILINOGEN UA: 1 mg/dL (ref 0.0–1.0)

## 2014-06-08 LAB — CBC
HCT: 28.6 % — ABNORMAL LOW (ref 36.0–46.0)
Hemoglobin: 7.5 g/dL — ABNORMAL LOW (ref 12.0–15.0)
MCH: 18.4 pg — ABNORMAL LOW (ref 26.0–34.0)
MCHC: 26.2 g/dL — ABNORMAL LOW (ref 30.0–36.0)
MCV: 70.1 fL — ABNORMAL LOW (ref 78.0–100.0)
PLATELETS: 240 10*3/uL (ref 150–400)
RBC: 4.08 MIL/uL (ref 3.87–5.11)
RDW: 19.9 % — ABNORMAL HIGH (ref 11.5–15.5)
WBC: 6.1 10*3/uL (ref 4.0–10.5)

## 2014-06-08 LAB — CLOSTRIDIUM DIFFICILE BY PCR: Toxigenic C. Difficile by PCR: NEGATIVE

## 2014-06-08 MED ORDER — LEVOFLOXACIN 750 MG PO TABS: 750.0000 mg | ORAL_TABLET | Freq: Every day | ORAL | Status: DC

## 2014-06-08 MED ORDER — LEVOFLOXACIN 750 MG PO TABS
750.0000 mg | ORAL_TABLET | Freq: Every day | ORAL | Status: DC
  Filled 2014-06-08: qty 1

## 2014-06-08 MED ORDER — GUAIFENESIN-DM 100-10 MG/5ML PO SYRP: 5.0000 mL | ORAL_SOLUTION | ORAL | Status: DC | PRN

## 2014-06-08 MED ORDER — ALBUTEROL SULFATE (2.5 MG/3ML) 0.083% IN NEBU: 2.5000 mg | INHALATION_SOLUTION | RESPIRATORY_TRACT | Status: AC | PRN

## 2014-06-08 NOTE — Discharge Instructions (Signed)

## 2014-06-08 NOTE — Discharge Summary (Signed)
Physician Discharge Summary  Stacy Harrington ZOX:[id number] DOB: 03/30/1986 DOA: 06/05/2014  PCP: No primary care provider on file.  Admit date: 06/05/2014 Discharge date: 06/08/2014  Recommendations for Outpatient Follow-up:  1. Pt will need to follow up with PCP in 2-3 weeks post discharge 2. Please obtain BMP to evaluate electrolytes and kidney function 3. Please also check CBC to evaluate Hg and Hct levels 4. Complete therapy with Levaquin   Discharge Diagnoses:  Principal Problem:   CAP (community acquired pneumonia) Active Problems:   Bronchial asthma   Anemia, iron deficiency    Discharge Condition: Stable  Diet recommendation: Heart healthy diet discussed in details     Brief narrative: 29 y.o. female with history of asthma and chronic iron deficiency anemia, presented to the ER because of shortness of breath and productive cough. In the ER chest x-ray consistent with multilobar pneumonia. Patient has been admitted for further management. Patient denies any recent travel or sick contacts. Patient denies having taken any recent antibiotics.  Assessment and Plan:    Principal Problem:  Acute hypoxic respiratory failure - secondary to multilobar PNA, imposed on chronic respiratory failure from asthma  - clinically stable this AM and no wheezing - continue Levaquin upon discharge  - continue BD's as needed - anti tussives can be provided if needed  Active Problems:  Anemia, iron deficiency - chronic, no signs of active bleeding  Morbid obesity, BMI > 40 - ambulate   Diarrhea - no stool studies obtained as diarrhea now resolved    Code Status: Full Family Communication: Pt at bedside Disposition Plan: Home   IV Access:    Peripheral IV Procedures and diagnostic studies:    Dg Chest 2 View 06/05/2014 Right lower lobe airspace opacity favoring pneumonia. There is also faint left perihilar airspace opacity, and accordingly a multilobar pneumonia  cannot be excluded. Borderline enlargement of the cardiopericardial silhouette.  Ct Angio Chest Pe W/cm &/or Wo Cm 06/06/2014 Multifocal pneumonia is noted, most prominently seen in the right lower lobe. There appears to be no definite evidence of large central pulmonary embolus. However, this exam is significantly limited due to attenuation to body habitus as well as respiratory motion artifact, and therefore smaller peripheral pulmonary emboli cannot be excluded on the basis of this exam.  Medical Consultants:    None  Other Consultants:    None  Anti-Infectives:    Levaquin 1/3 -->   Discharge Exam: Filed Vitals:   06/08/14 0644  BP: 94/68  Pulse: 80  Temp: 98.3 F (36.8 C)  Resp: 16   Filed Vitals:   06/07/14 0755 06/07/14 1356 06/07/14 2252 06/08/14 0644  BP:  119/62 109/71 94/68  Pulse:  71 91 80  Temp:  97.3 F (36.3 C) 98.4 F (36.9 C) 98.3 F (36.8 C)  TempSrc:  Oral Oral Oral  Resp:  16 16 16   Height:      Weight:      SpO2: 98% 96% 94% 99%    General: Pt is alert, follows commands appropriately, not in acute distress Cardiovascular: Regular rate and rhythm, S1/S2 +, no murmurs, no rubs, no gallops Respiratory: Clear to auscultation bilaterally, no wheezing, no crackles, no rhonchi Abdominal: Soft, non tender, non distended, bowel sounds +, no guarding Extremities: no edema, no cyanosis, pulses palpable bilaterally DP and PT Neuro: Grossly nonfocal  Discharge Instructions     Medication List    TAKE these medications        albuterol 108 (90 BASE)  MCG/ACT inhaler  Commonly known as:  PROVENTIL HFA;VENTOLIN HFA  Inhale 2 puffs into the lungs every 6 (six) hours as needed for wheezing or shortness of breath.     albuterol (2.5 MG/3ML) 0.083% nebulizer solution  Commonly known as:  PROVENTIL  Take 3 mLs (2.5 mg total) by nebulization every 4 (four) hours as needed for wheezing or shortness of breath.     guaiFENesin-dextromethorphan  100-10 MG/5ML syrup  Commonly known as:  ROBITUSSIN DM  Take 5 mLs by mouth every 4 (four) hours as needed for cough.     IBUPROFEN PO  Take by mouth.     levofloxacin 750 MG tablet  Commonly known as:  LEVAQUIN  Take 1 tablet (750 mg total) by mouth daily.     omeprazole 20 MG capsule  Commonly known as:  PRILOSEC  Take 20 mg by mouth daily.     THERAFLU FLU/COLD PO  Take by mouth.     TYLENOL PO  Take by mouth.          The results of significant diagnostics from this hospitalization (including imaging, microbiology, ancillary and laboratory) are listed below for reference.     Microbiology: Recent Results (from the past 240 hour(s))  Culture, blood (routine x 2) Call MD if unable to obtain prior to antibiotics being given     Status: None (Preliminary result)   Collection Time: 06/05/14  9:17 PM  Result Value Ref Range Status   Specimen Description BLOOD LEFT HAND  Final   Special Requests BOTTLES DRAWN AEROBIC ONLY 10CC  Final   Culture   Final           BLOOD CULTURE RECEIVED NO GROWTH TO DATE CULTURE WILL BE HELD FOR 5 DAYS BEFORE ISSUING A FINAL NEGATIVE REPORT Performed at [facility]    Report Status PENDING  Incomplete  Culture, blood (routine x 2) Call MD if unable to obtain prior to antibiotics being given     Status: None (Preliminary result)   Collection Time: 06/05/14  9:22 PM  Result Value Ref Range Status   Specimen Description BLOOD LEFT ARM  Final   Special Requests BOTTLES DRAWN AEROBIC AND ANAEROBIC 8CC  Final   Culture   Final           BLOOD CULTURE RECEIVED NO GROWTH TO DATE CULTURE WILL BE HELD FOR 5 DAYS BEFORE ISSUING A FINAL NEGATIVE REPORT Performed at [facility]    Report Status PENDING  Incomplete  Culture, sputum-assessment     Status: None   Collection Time: 06/06/14  4:19 AM  Result Value Ref Range Status   Specimen Description SPUTUM  Final   Special Requests NONE  Final   Sputum evaluation   Final     MICROSCOPIC FINDINGS SUGGEST THAT THIS SPECIMEN IS NOT REPRESENTATIVE OF LOWER RESPIRATORY SECRETIONS. PLEASE RECOLLECT. NOTIFIED V.JACKSON,RN AT 0443 ON 06/06/14 BY SHEA.W    Report Status 06/06/2014 FINAL  Final     Labs: Basic Metabolic Panel:  Recent Labs Lab 06/05/14 1630 06/05/14 2200 06/06/14 0450 06/07/14 0504 06/08/14 0425  NA 136  --  135 135 136  K 3.6  --  3.5 4.2 3.9  CL 103  --  102 103 104  CO2 27  --  GLUCOSE 118*  --  102* 93 102*  BUN 7  --  CREATININE 0.71 0.58 0.74 0.58 0.62  CALCIUM 8.7  --  8.4 8.5  8.3*   Liver Function Tests:  Recent Labs Lab 06/06/14 0450  AST 26  ALT 15  ALKPHOS 72  BILITOT 0.5  PROT 7.6  ALBUMIN 3.2*   No results for input(s): LIPASE, AMYLASE in the last 168 hours. No results for input(s): AMMONIA in the last 168 hours. CBC:  Recent Labs Lab 06/05/14 1630 06/05/14 2200 06/06/14 0450 06/07/14 0504 06/08/14 0425  WBC 6.1 6.7 6.4 6.8 6.1  NEUTROABS 3.7  --  3.5  --   --   HGB 8.2* 8.1* 7.9* 7.7* 7.5*  HCT 30.6* 30.2* 29.2* 29.2* 28.6*  MCV 68.6* 68.3* 68.5* 69.0* 70.1*  PLT 361 325 323 300 240     SIGNED: Time coordinating discharge: Over 30 minutes  Debbora Presto, MD  [facility] 06/08/2014, 9:53 AM Pager [phone]  If 7PM-7AM, please contact night-coverage [email or web address] Password [email or web address]

## 2014-06-08 NOTE — Progress Notes (Signed)
ANTIBIOTIC CONSULT NOTE - Follow-up  Pharmacy Consult for Levaquin Indication: pneumonia  Allergies  Allergen Reactions  . Aloe Vera Hives  . Ceclor [Cefaclor] Hives    Per mother when she was young  . Codeine     Hallucinations.      Patient Measurements: Height: 5\' 4"  (162.6 cm) Weight: 300 lb (136.079 kg) IBW/kg (Calculated) : 54.7  Vital Signs: Temp: 98.3 F (36.8 C) (01/06 0644) Temp Source: Oral (01/06 0644) BP: 94/68 mmHg (01/06 0644) Pulse Rate: 80 (01/06 0644) Intake/Output from previous day: 01/05 0701 - 01/06 0700 In: 720 [P.O.:720] Out: -  Intake/Output from this shift:    Labs:  Recent Labs  06/06/14 0450 06/07/14 0504 06/08/14 0425  WBC 6.4 6.8 6.1  HGB 7.9* 7.7* 7.5*  PLT 323 300 240  CREATININE 0.74 0.58 0.62   Estimated Creatinine Clearance: 141.7 mL/min (by C-G formula based on Cr of 0.62). No results for input(s): VANCOTROUGH, VANCOPEAK, VANCORANDOM, GENTTROUGH, GENTPEAK, GENTRANDOM, TOBRATROUGH, TOBRAPEAK, TOBRARND, AMIKACINPEAK, AMIKACINTROU, AMIKACIN in the last 72 hours.   Microbiology: Recent Results (from the past 720 hour(s))  Culture, blood (routine x 2) Call MD if unable to obtain prior to antibiotics being given     Status: None (Preliminary result)   Collection Time: 06/05/14  9:17 PM  Result Value Ref Range Status   Specimen Description BLOOD LEFT HAND  Final   Special Requests BOTTLES DRAWN AEROBIC ONLY 10CC  Final   Culture   Final           BLOOD CULTURE RECEIVED NO GROWTH TO DATE CULTURE WILL BE HELD FOR 5 DAYS BEFORE ISSUING A FINAL NEGATIVE REPORT Performed at [facility]    Report Status PENDING  Incomplete  Culture, blood (routine x 2) Call MD if unable to obtain prior to antibiotics being given     Status: None (Preliminary result)   Collection Time: 06/05/14  9:22 PM  Result Value Ref Range Status   Specimen Description BLOOD LEFT ARM  Final   Special Requests BOTTLES DRAWN AEROBIC AND ANAEROBIC 8CC   Final   Culture   Final           BLOOD CULTURE RECEIVED NO GROWTH TO DATE CULTURE WILL BE HELD FOR 5 DAYS BEFORE ISSUING A FINAL NEGATIVE REPORT Performed at [facility]    Report Status PENDING  Incomplete  Culture, sputum-assessment     Status: None   Collection Time: 06/06/14  4:19 AM  Result Value Ref Range Status   Specimen Description SPUTUM  Final   Special Requests NONE  Final   Sputum evaluation   Final    MICROSCOPIC FINDINGS SUGGEST THAT THIS SPECIMEN IS NOT REPRESENTATIVE OF LOWER RESPIRATORY SECRETIONS. PLEASE RECOLLECT. NOTIFIED V.JACKSON,RN AT 0443 ON 06/06/14 BY SHEA.W    Report Status 06/06/2014 FINAL  Final    Medical History: Past Medical History  Diagnosis Date  . Asthma     uses inhaler    Medications:  Scheduled:  . guaiFENesin  600 mg Oral BID  . ipratropium-albuterol  3 mL Nebulization TID  . levofloxacin (LEVAQUIN) IV  750 mg Intravenous Q24H  . pantoprazole  40 mg Oral Daily   Infusions:   Assessment:  28 yr female with cough, congestion, weakness, fatigue, N/V.  H/O asthma  Chest Xray shows favoring PNA in right lower lobe. Cannot exclude multilobar PNA  Blood and sputum cultures ordered  Patient received Azithromycin 500mg  IV x 1 in ED @ 18:05  Pharmacy consulted  to begin Levaquin   Pregnancy test resulted negative  Antibiotics: 1/3 >>Azith x 1 in ED 1/3 >>Levaquin >>   Labs:  Tmax24h: AF WBCs: WNL Renal: Scr stable, CrCl >100  Microbiology 1/3 blood: NGTD 1/3 sputum: needs re-collect  1/5 stool culture: collected 1/5 CDiff: collected  Goal of Therapy:  Eradication of infection  Plan:  Continue Levaquin  q24h - change to PO, noted 7 day course per MD assessment  Haynes Hoehn, PharmD, BCPS 06/08/2014, 8:00 AM  Telephone: [phone]

## 2014-06-08 NOTE — Progress Notes (Signed)
Discharge instructions and medications reviewed with patient. Patient confirms all belongings are in her possession. Patient has no questions at this time. Note for work given to patient.

## 2014-06-08 NOTE — Progress Notes (Signed)
CARE MANAGEMENT NOTE 06/08/2014  Patient:  Stacy Harrington,Stacy S   Account Number:  [id number]  Date Initiated:  06/06/2014  Documentation initiated by:  Ferdinand CavaSCHETTINO,Isay Perleberg  Subjective/Objective Assessment:   29 yo female admitted with CAP from home with spouse     Action/Plan:   discharge planning   Anticipated DC Date:  06/08/2014   Anticipated DC Plan:  HOME/SELF CARE      DC Planning Services  CM consult      Choice offered to / List presented to:             Status of service:  Completed, signed off Medicare Important Message given?   (If response is "NO", the following Medicare IM given date fields will be blank) Date Medicare IM given:   Medicare IM given by:   Date Additional Medicare IM given:   Additional Medicare IM given by:    Discharge Disposition:    Per UR Regulation:    If discussed at Long Length of Stay Meetings, dates discussed:    Comments:  06/08/14 Ferdinand CavaAndrea Schettino RN BSN CM [phone] [facility] Friday, January 8th at 9:00 am. Patient is to follow up with discvharge paperwork at this appointment with dc paperwork for recommendation for nebulizer. Attending MD and patient aware.  06/06/14 Ferdinand CavaAndrea Schettino RN BSn CM [phone] Patient confirmed no PCP or insurance. Provided patient with [facility] pamphlet for foolow up care and also provided 5 [facility] [place] [facility] [place] [facility].

## 2014-06-09 LAB — URINE CULTURE
COLONY COUNT: NO GROWTH
CULTURE: NO GROWTH

## 2014-06-10 ENCOUNTER — Ambulatory Visit: Payer: Self-pay | Attending: Internal Medicine | Admitting: Internal Medicine

## 2014-06-10 ENCOUNTER — Encounter: Payer: Self-pay | Admitting: Internal Medicine

## 2014-06-10 VITALS — BP 107/66 | HR 91 | Temp 97.8°F | Resp 18 | Ht 64.0 in | Wt >= 6400 oz

## 2014-06-10 DIAGNOSIS — Z79899 Other long term (current) drug therapy: Secondary | ICD-10-CM | POA: Insufficient documentation

## 2014-06-10 DIAGNOSIS — R062 Wheezing: Secondary | ICD-10-CM

## 2014-06-10 DIAGNOSIS — D509 Iron deficiency anemia, unspecified: Secondary | ICD-10-CM

## 2014-06-10 DIAGNOSIS — J45901 Unspecified asthma with (acute) exacerbation: Secondary | ICD-10-CM

## 2014-06-10 DIAGNOSIS — R609 Edema, unspecified: Secondary | ICD-10-CM

## 2014-06-10 DIAGNOSIS — Z6841 Body Mass Index (BMI) 40.0 and over, adult: Secondary | ICD-10-CM | POA: Insufficient documentation

## 2014-06-10 DIAGNOSIS — J189 Pneumonia, unspecified organism: Secondary | ICD-10-CM

## 2014-06-10 DIAGNOSIS — B59 Pneumocystosis: Secondary | ICD-10-CM | POA: Insufficient documentation

## 2014-06-10 MED ORDER — ALBUTEROL SULFATE HFA 108 (90 BASE) MCG/ACT IN AERS: 2.0000 | INHALATION_SPRAY | Freq: Four times a day (QID) | RESPIRATORY_TRACT | Status: DC | PRN

## 2014-06-10 MED ORDER — PREDNISONE 50 MG PO TABS: ORAL_TABLET | ORAL | Status: DC

## 2014-06-10 MED ORDER — ALBUTEROL SULFATE (2.5 MG/3ML) 0.083% IN NEBU
2.5000 mg | INHALATION_SOLUTION | Freq: Once | RESPIRATORY_TRACT | Status: AC
  Administered 2014-06-10: 2.5 mg via RESPIRATORY_TRACT

## 2014-06-10 MED ORDER — FERROUS SULFATE 300 (60 FE) MG/5ML PO SYRP: 300.0000 mg | ORAL_SOLUTION | Freq: Three times a day (TID) | ORAL | Status: DC

## 2014-06-10 NOTE — Progress Notes (Signed)
Patient ID: Stacy Harrington, female   DOB: 09/05/1985, 29 y.o.   MRN: [id number]  [id number]  [id number]  DOB - 05/14/1986  CC:  Chief Complaint  Patient presents with  . Hospitalization Follow-up  . Asthma  . Pneumonia       HPI: Stacy Harrington is a 29 y.o. female here today to establish medical care, with history of asthma and chronic iron deficiency anemia.  Patient presented to the ER on 06/05/14 because of shortness of breath and productive cough. In the ER chest x-ray consistent with multilobar pneumonia. Patient was at that time admitted for further evaluation.  She was discharged with Levaquin and has 5 days left of treatment.  She states that she continues to have cough and SOB.  She uses her nebulizer 3 times per day and notes increased coughing and wheezing at night.   The patients mother is concerned about BLE edema for several months because of the strong family history of CHF.  Patient reports that she has not taken her iron in several months but has had multiple blood transfusion in the past.    Patient has No headache, No chest pain, No abdominal pain - No Nausea, No new weakness tingling or numbness.  Allergies  Allergen Reactions  . Aloe Vera Hives  . Ceclor [Cefaclor] Hives    Per mother when she was young  . Codeine     Hallucinations.     Past Medical History  Diagnosis Date  . Asthma     uses inhaler  . Anemia    Current Outpatient Prescriptions on File Prior to Visit  Medication Sig Dispense Refill  . albuterol (PROVENTIL HFA;VENTOLIN HFA) 108 (90 BASE) MCG/ACT inhaler Inhale 2 puffs into the lungs every 6 (six) hours as needed for wheezing or shortness of breath.    Marland Kitchen. albuterol (PROVENTIL) (2.5 MG/3ML) 0.083% nebulizer solution Take 3 mLs (2.5 mg total) by nebulization every 4 (four) hours as needed for wheezing or shortness of breath. 75 mL 12  . guaiFENesin-dextromethorphan (ROBITUSSIN DM) 100-10 MG/5ML syrup Take 5 mLs by mouth every 4 (four) hours as  needed for cough. 118 mL 0  . levofloxacin (LEVAQUIN) 750 MG tablet Take 1 tablet (750 mg total) by mouth daily. 7 tablet 0  . Acetaminophen (TYLENOL PO) Take by mouth.    . Chlorphen-Pseudoephed-APAP (THERAFLU FLU/COLD PO) Take by mouth.    . IBUPROFEN PO Take by mouth.    Marland Kitchen. omeprazole (PRILOSEC) 20 MG capsule Take 20 mg by mouth daily.     No current facility-administered medications on file prior to visit.   Family History  Problem Relation Age of Onset  . Asthma Mother   . Hypertension Mother   . Asthma Father   . Diabetes Mellitus II Father   . Cancer Father    History   Social History  . Marital Status: Single    Spouse Name: N/A    Number of Children: N/A  . Years of Education: N/A   Occupational History  . Not on file.   Social History Main Topics  . Smoking status: Never Smoker   . Smokeless tobacco: Not on file  . Alcohol Use: No  . Drug Use: Not on file  . Sexual Activity: Not on file   Other Topics Concern  . Not on file   Social History Narrative    Review of Systems  Constitutional: Positive for malaise/fatigue. Negative for fever, chills, weight loss and diaphoresis.  Respiratory: Positive for cough,  shortness of breath and wheezing. Negative for hemoptysis.   Cardiovascular: Positive for leg swelling. Negative for chest pain, palpitations and orthopnea.  Neurological: Positive for weakness.  All other systems reviewed and are negative.     Objective:   Filed Vitals:   06/10/14 0951  BP: 107/66  Pulse: 91  Temp: 97.8 F (36.6 C)  Resp: 18    Physical Exam  Constitutional: She is oriented to person, place, and time. No distress.  HENT:  Right Ear: External ear normal.  Left Ear: External ear normal.  Mouth/Throat: Oropharynx is clear and moist.  Eyes: EOM are normal. Pupils are equal, round, and reactive to light.  Cardiovascular: Normal rate, regular rhythm and normal heart sounds.   Pulmonary/Chest: Effort normal. No respiratory  distress. She has wheezes.  Abdominal: Soft. Bowel sounds are normal.  Musculoskeletal: She exhibits edema (BLE).  Lymphadenopathy:    She has no cervical adenopathy.  Neurological: She is alert and oriented to person, place, and time.  Skin: Skin is warm and dry.  Psychiatric: She has a normal mood and affect.     Lab Results  Component Value Date   WBC 6.1 06/08/2014   HGB 7.5* 06/08/2014   HCT 28.6* 06/08/2014   MCV 70.1* 06/08/2014   PLT 240 06/08/2014   Lab Results  Component Value Date   CREATININE 0.62 06/08/2014   BUN 10 06/08/2014   NA 136 06/08/2014   K 3.9 06/08/2014   CL 104 06/08/2014   CO2 26 06/08/2014    No results found for: HGBA1C Lipid Panel  No results found for: CHOL, TRIG, HDL, CHOLHDL, VLDL, LDLCALC     Assessment and plan:   [name] was seen today for hospitalization follow-up, asthma and pneumonia.  Diagnoses and associated orders for this visit:  CAP (community acquired pneumonia) Resolving   Asthma with exacerbation, unspecified asthma severity - predniSONE (DELTASONE) 50 MG tablet; Take one pill once per day - Refill albuterol (PROVENTIL HFA;VENTOLIN HFA) 108 (90 BASE) MCG/ACT inhaler; Inhale 2 puffs into the lungs every 6 (six) hours as needed for wheezing or shortness of breath.  Wheezing - albuterol (PROVENTIL) (2.5 MG/3ML) 0.083% nebulizer solution 2.5 mg; Take 3 mLs (2.5 mg total) by nebulization once.  Morbid obesity - Amb Referral to Nutrition and Diabetic E BMI>69. Patient has very high mortality rate based off weight and other co-morbidities. I stressed serious weight loss to patient and how she is at high risk for MI and CVD. Advised patient that I would like for her to lose at least 10 pounds by our next follow up visit. 20 minutes spent counseling of lifestyle changes and diet changes that will promote weight loss.   Edema - 2D Echocardiogram without contrast; Future---r/o CHF  Anemia, iron deficiency - Begin ferrous  sulfate 300 (60 FE) MG/5ML syrup; Take 5 mLs (300 mg total) by mouth 3 (three) times daily with meals.   Return in about 2 weeks (around 06/24/2014) for pcp PNEUMONIA .  The patient was given clear instructions to go to ER or return to medical center if symptoms don't improve, worsen or new problems develop. The patient verbalized understanding. The patient was told to call to get lab results if they haven't heard anything in the next week.     Holland Commons, NP-C [facility] [phone] 06/10/2014, 10:20 AM

## 2014-06-10 NOTE — Patient Instructions (Signed)
Take iron supplements 3 times per day and I will see you again in 2 weeks for blood work and for Pneumonia follow up.  If you do not feel improvement return to clinic sooner

## 2014-06-10 NOTE — Progress Notes (Signed)
1. Pneumonia - patient has been placed on Levaquin for community-acquired pneumonia. Check influenza PCR, urine for strep and legionella antigen. Since patient has some exertional symptoms we'll check d-dimer and BNP. If d-dimer is elevated we will get CT angiogram of the chest. 2. Diarrhea - may be related to pneumonia but check stool studies. 3. Chronic macrocytic hypochromic iron deficiency anemia - follow CBC and iron replacement. 4. Bronchial asthma - presently not actively wheezing. Continue when necessary albuterol.  Stacy Harrington is a 29 y.o. female with history of asthma and chronic pain deficiency anemia presents to the ER because of shortness of breath and productive cough. Patient also has been having diarrhea for last 1 day. Patient states she has been having productive cough and shortness of breath sometimes exertional. Denies any chest pain. In the ER chest x-ray shows features consistent with multilobar pneumonia. Patient has been admitted for further management. Patient denies any recent travel or sick contacts. Patient denies having taken any recent antibiotics. Diarrhea is watery denies any abdominal pain.    Pt comes in HFU- S/P Bilat [facility] Taking Levofloxacin 750 mg tab daily C/o bilat pain under both ribs  Using albuterol inhaler/Neb treatment  Rhonchi noted in right lower lobe 100%r/a Hx- Childhood Asthma Dry cough noted

## 2014-06-11 LAB — STOOL CULTURE

## 2014-06-12 LAB — CULTURE, BLOOD (ROUTINE X 2)
CULTURE: NO GROWTH
Culture: NO GROWTH

## 2014-06-14 ENCOUNTER — Ambulatory Visit (HOSPITAL_COMMUNITY)
Admission: RE | Admit: 2014-06-14 | Discharge: 2014-06-14 | Disposition: A | Discharge status: Home / Self Care | Payer: Self-pay | Source: Ambulatory Visit | Attending: Internal Medicine | Admitting: Internal Medicine

## 2014-06-14 DIAGNOSIS — I379 Nonrheumatic pulmonary valve disorder, unspecified: Secondary | ICD-10-CM

## 2014-06-14 DIAGNOSIS — R609 Edema, unspecified: Secondary | ICD-10-CM | POA: Insufficient documentation

## 2014-06-14 NOTE — Progress Notes (Signed)
  Echocardiogram 2D Echocardiogram has been performed.  Leta JunglingCooper, Oree Mirelez M 06/14/2014, 11:25 AM

## 2014-06-23 ENCOUNTER — Telehealth: Payer: Self-pay | Admitting: *Deleted

## 2014-06-23 NOTE — Telephone Encounter (Signed)
Left voice message to return call 

## 2014-06-23 NOTE — Telephone Encounter (Signed)
-----   Message from Ambrose FinlandValerie A Keck, NP sent at 06/21/2014 10:31 PM EST ----- Normal heart function, not the cause of edema. No sign of CHF

## 2014-07-04 ENCOUNTER — Ambulatory Visit: Payer: Self-pay | Admitting: Internal Medicine

## 2014-07-07 ENCOUNTER — Ambulatory Visit: Payer: Self-pay | Attending: Internal Medicine | Admitting: Internal Medicine

## 2014-07-07 ENCOUNTER — Ambulatory Visit: Payer: Self-pay | Attending: Internal Medicine

## 2014-07-07 ENCOUNTER — Encounter: Payer: Self-pay | Admitting: Internal Medicine

## 2014-07-07 ENCOUNTER — Ambulatory Visit: Payer: Self-pay

## 2014-07-07 VITALS — BP 130/80 | HR 90 | Temp 98.5°F | Resp 14 | Ht 64.5 in | Wt >= 6400 oz

## 2014-07-07 DIAGNOSIS — E669 Obesity, unspecified: Secondary | ICD-10-CM | POA: Insufficient documentation

## 2014-07-07 DIAGNOSIS — D649 Anemia, unspecified: Secondary | ICD-10-CM | POA: Insufficient documentation

## 2014-07-07 DIAGNOSIS — R0602 Shortness of breath: Secondary | ICD-10-CM | POA: Insufficient documentation

## 2014-07-07 DIAGNOSIS — J189 Pneumonia, unspecified organism: Secondary | ICD-10-CM | POA: Insufficient documentation

## 2014-07-07 MED ORDER — METFORMIN HCL 500 MG PO TABS: 500.0000 mg | ORAL_TABLET | Freq: Every day | ORAL | Status: DC

## 2014-07-07 NOTE — Progress Notes (Signed)
Patient ID: Stacy Harrington, female   DOB: 05/09/1986, 29 y.o.   MRN: [id number]  CC: PNA follow up  HPI: Stacy Harrington is a 29 y.o. female here today for a follow up visit.  Patient has past medical history of anemia.  She presents to clinic today for a follow up of CAP.  She states that she has been feeling much better since our last visit but she continues to have some SOB.  Today she is concerned about weight loss. She is very upset during history taking due to recent weight gain since last visit 2 weeks ago. She reports that he has began walking 3 days per week and has dramatically cut back on her eating habits.   Patient has No headache, No chest pain, No abdominal pain - No Nausea, No new weakness tingling or numbness.  Allergies  Allergen Reactions  . Aloe Vera Hives  . Ceclor [Cefaclor] Hives    Per mother when she was young  . Codeine     Hallucinations.     Past Medical History  Diagnosis Date  . Asthma     uses inhaler  . Anemia    Current Outpatient Prescriptions on File Prior to Visit  Medication Sig Dispense Refill  . Acetaminophen (TYLENOL PO) Take by mouth.    Marland Kitchen. albuterol (PROVENTIL HFA;VENTOLIN HFA) 108 (90 BASE) MCG/ACT inhaler Inhale 2 puffs into the lungs every 6 (six) hours as needed for wheezing or shortness of breath. 1 Inhaler 4  . albuterol (PROVENTIL) (2.5 MG/3ML) 0.083% nebulizer solution Take 3 mLs (2.5 mg total) by nebulization every 4 (four) hours as needed for wheezing or shortness of breath. 75 mL 12  . ferrous sulfate 300 (60 FE) MG/5ML syrup Take 5 mLs (300 mg total) by mouth 3 (three) times daily with meals. 150 mL 3  . guaiFENesin-dextromethorphan (ROBITUSSIN DM) 100-10 MG/5ML syrup Take 5 mLs by mouth every 4 (four) hours as needed for cough. 118 mL 0  . omeprazole (PRILOSEC) 20 MG capsule Take 20 mg by mouth daily.    . Chlorphen-Pseudoephed-APAP (THERAFLU FLU/COLD PO) Take by mouth.    . IBUPROFEN PO Take by mouth.    . levofloxacin (LEVAQUIN) 750  MG tablet Take 1 tablet (750 mg total) by mouth daily. (Patient not taking: Reported on 07/07/2014) 7 tablet 0  . predniSONE (DELTASONE) 50 MG tablet Take one pill once per day (Patient not taking: Reported on 07/07/2014) 6 tablet 0   No current facility-administered medications on file prior to visit.   Family History  Problem Relation Age of Onset  . Asthma Mother   . Hypertension Mother   . Asthma Father   . Diabetes Mellitus II Father   . Cancer Father    History   Social History  . Marital Status: Single    Spouse Name: N/A    Number of Children: N/A  . Years of Education: N/A   Occupational History  . Not on file.   Social History Main Topics  . Smoking status: Never Smoker   . Smokeless tobacco: Not on file  . Alcohol Use: No  . Drug Use: Not on file  . Sexual Activity: Not on file   Other Topics Concern  . Not on file   Social History Narrative    Review of Systems: See HPI  Objective:   Filed Vitals:   07/07/14 1740  BP: 130/80  Pulse: 90  Temp: 98.5 F (36.9 C)  Resp: 14  Physical Exam  Constitutional: She is oriented to person, place, and time.  Cardiovascular: Normal rate, regular rhythm and normal heart sounds.   Pulmonary/Chest: Effort normal and breath sounds normal. She has no wheezes.  Neurological: She is alert and oriented to person, place, and time.     Lab Results  Component Value Date   WBC 6.1 06/08/2014   HGB 7.5* 06/08/2014   HCT 28.6* 06/08/2014   MCV 70.1* 06/08/2014   PLT 240 06/08/2014   Lab Results  Component Value Date   CREATININE 0.62 06/08/2014   BUN 10 06/08/2014   NA 136 06/08/2014   K 3.9 06/08/2014   CL 104 06/08/2014   CO2 26 06/08/2014    No results found for: HGBA1C Lipid Panel  No results found for: CHOL, TRIG, HDL, CHOLHDL, VLDL, LDLCALC     Assessment and plan:   Vicenta was seen today for follow-up.  Diagnoses and all orders for this visit:  Obesity Orders: -     TSH -     T4, Free -     Begin metFORMIN (GLUCOPHAGE) 500 MG tablet; Take 1 tablet (500 mg total) by mouth daily after supper.Would like patient to try for 2 months and we will reassess weight loss at that time.  Gave patient information to weight loss providers and bariatric centers to see what other options may be available.  Anemia, unspecified anemia type Orders: -     CBC  F/u in 2 months for weight loss options      Holland Commons, NP-C [facility] [phone] 07/07/2014, 6:03 PM

## 2014-07-07 NOTE — Progress Notes (Signed)
Pt is here following up after having pneumonia. Pt states that she is feeling better but she still has SOB.

## 2014-07-08 ENCOUNTER — Telehealth: Payer: Self-pay | Admitting: *Deleted

## 2014-07-08 LAB — CBC
HCT: 33 % — ABNORMAL LOW (ref 36.0–46.0)
Hemoglobin: 9 g/dL — ABNORMAL LOW (ref 12.0–15.0)
MCH: 18.7 pg — ABNORMAL LOW (ref 26.0–34.0)
MCHC: 27.3 g/dL — AB (ref 30.0–36.0)
MCV: 68.5 fL — ABNORMAL LOW (ref 78.0–100.0)
MPV: 9 fL (ref 8.6–12.4)
PLATELETS: 398 10*3/uL (ref 150–400)
RBC: 4.82 MIL/uL (ref 3.87–5.11)
RDW: 20.5 % — AB (ref 11.5–15.5)
WBC: 9.5 10*3/uL (ref 4.0–10.5)

## 2014-07-08 LAB — TSH: TSH: 3.611 u[IU]/mL (ref 0.350–4.500)

## 2014-07-08 LAB — T4, FREE: Free T4: 0.97 ng/dL (ref 0.80–1.80)

## 2014-07-08 NOTE — Telephone Encounter (Signed)
-----   Message from Ambrose FinlandValerie A Keck, NP sent at 07/08/2014 12:59 PM EST ----- Thyroid id normal.  Hemoglobin is improving, continue with medication.

## 2014-07-08 NOTE — Telephone Encounter (Signed)
Pt aware of results 

## 2014-07-20 ENCOUNTER — Other Ambulatory Visit: Payer: Self-pay | Admitting: Internal Medicine

## 2014-07-20 DIAGNOSIS — J45901 Unspecified asthma with (acute) exacerbation: Secondary | ICD-10-CM

## 2014-07-20 MED ORDER — ALBUTEROL SULFATE HFA 108 (90 BASE) MCG/ACT IN AERS: 2.0000 | INHALATION_SPRAY | Freq: Four times a day (QID) | RESPIRATORY_TRACT | Status: AC | PRN

## 2014-08-01 ENCOUNTER — Ambulatory Visit: Payer: Self-pay | Admitting: Dietician

## 2015-03-15 ENCOUNTER — Telehealth: Payer: Self-pay | Admitting: Internal Medicine

## 2015-03-15 NOTE — Telephone Encounter (Signed)
Patients mother called stating pt. Has a her period for over a month. Patient feels very weak, and would like to know what she can do. Patient has a a blood transfusion in the past because of her periods. Pt. Has an appt. Coming up on 03/21/15, and does not think she can make it.

## 2015-03-21 ENCOUNTER — Encounter: Payer: Self-pay | Admitting: Internal Medicine

## 2015-03-21 ENCOUNTER — Ambulatory Visit: Payer: Self-pay | Attending: Internal Medicine | Admitting: Internal Medicine

## 2015-03-21 VITALS — BP 147/85 | HR 106 | Temp 98.0°F | Resp 17 | Ht 65.0 in | Wt >= 6400 oz

## 2015-03-21 DIAGNOSIS — Z825 Family history of asthma and other chronic lower respiratory diseases: Secondary | ICD-10-CM | POA: Insufficient documentation

## 2015-03-21 DIAGNOSIS — Z833 Family history of diabetes mellitus: Secondary | ICD-10-CM | POA: Insufficient documentation

## 2015-03-21 DIAGNOSIS — J45909 Unspecified asthma, uncomplicated: Secondary | ICD-10-CM | POA: Insufficient documentation

## 2015-03-21 DIAGNOSIS — K047 Periapical abscess without sinus: Secondary | ICD-10-CM | POA: Insufficient documentation

## 2015-03-21 DIAGNOSIS — Z79899 Other long term (current) drug therapy: Secondary | ICD-10-CM | POA: Insufficient documentation

## 2015-03-21 DIAGNOSIS — Z6841 Body Mass Index (BMI) 40.0 and over, adult: Secondary | ICD-10-CM | POA: Insufficient documentation

## 2015-03-21 DIAGNOSIS — Z8249 Family history of ischemic heart disease and other diseases of the circulatory system: Secondary | ICD-10-CM | POA: Insufficient documentation

## 2015-03-21 DIAGNOSIS — D509 Iron deficiency anemia, unspecified: Secondary | ICD-10-CM | POA: Insufficient documentation

## 2015-03-21 DIAGNOSIS — N926 Irregular menstruation, unspecified: Secondary | ICD-10-CM | POA: Insufficient documentation

## 2015-03-21 MED ORDER — METFORMIN HCL 500 MG PO TABS: 500.0000 mg | ORAL_TABLET | Freq: Every day | ORAL | Status: AC

## 2015-03-21 MED ORDER — TRAMADOL HCL 50 MG PO TABS: 50.0000 mg | ORAL_TABLET | Freq: Three times a day (TID) | ORAL | Status: AC | PRN

## 2015-03-21 MED ORDER — CLINDAMYCIN HCL 150 MG PO CAPS: 150.0000 mg | ORAL_CAPSULE | Freq: Three times a day (TID) | ORAL | Status: DC

## 2015-03-21 NOTE — Progress Notes (Signed)
Patient ID: Stacy Harrington, female   DOB: 1986/02/23, 29 y.o.   MRN: [id number]  CC: irregular periods  HPI: Stacy Harrington is a 29 y.o. female here today for a follow up visit.  Patient has past medical history of anemia. Patient reports that monthly when her cycle comes she bleeds for 3 weeks continuously that has been heavy. She states that she ceases bleeding for one week and then it comes back. She notes a family history of uterine fibroids. She has continued to take Metformin to help with weight and iron syrup 2-3 times per day when she thinks about.   Patient also complains of right sided tooth pain for the past 2 weeks that is causing her face and ear to hurt. Has been taking ibuprofen around the clock. She states that she has several broken teeth in her mouth that she has been unable to get fixed due to lack of insurance.  Patient has No headache, No chest pain, No abdominal pain - No Nausea, No new weakness tingling or numbness, No Cough - SOB.  Allergies  Allergen Reactions  . Aloe Vera Hives  . Ceclor [Cefaclor] Hives    Per mother when she was young  . Codeine     Hallucinations.     Past Medical History  Diagnosis Date  . Asthma     uses inhaler  . Anemia    Current Outpatient Prescriptions on File Prior to Visit  Medication Sig Dispense Refill  . metFORMIN (GLUCOPHAGE) 500 MG tablet Take 1 tablet (500 mg total) by mouth daily after supper. 60 tablet 3  . omeprazole (PRILOSEC) 20 MG capsule Take 20 mg by mouth daily.    . Acetaminophen (TYLENOL PO) Take by mouth.    Marland Kitchen albuterol (PROVENTIL HFA;VENTOLIN HFA) 108 (90 BASE) MCG/ACT inhaler Inhale 2 puffs into the lungs every 6 (six) hours as needed for wheezing or shortness of breath. 3 Inhaler 3  . albuterol (PROVENTIL) (2.5 MG/3ML) 0.083% nebulizer solution Take 3 mLs (2.5 mg total) by nebulization every 4 (four) hours as needed for wheezing or shortness of breath. 75 mL 12  . Chlorphen-Pseudoephed-APAP (THERAFLU FLU/COLD PO)  Take by mouth.    . ferrous sulfate 300 (60 FE) MG/5ML syrup Take 5 mLs (300 mg total) by mouth 3 (three) times daily with meals. 150 mL 3  . guaiFENesin-dextromethorphan (ROBITUSSIN DM) 100-10 MG/5ML syrup Take 5 mLs by mouth every 4 (four) hours as needed for cough. 118 mL 0  . IBUPROFEN PO Take by mouth.    . levofloxacin (LEVAQUIN) 750 MG tablet Take 1 tablet (750 mg total) by mouth daily. (Patient not taking: Reported on 07/07/2014) 7 tablet 0  . predniSONE (DELTASONE) 50 MG tablet Take one pill once per day (Patient not taking: Reported on 07/07/2014) 6 tablet 0   No current facility-administered medications on file prior to visit.   Family History  Problem Relation Age of Onset  . Asthma Mother   . Hypertension Mother   . Asthma Father   . Diabetes Mellitus II Father   . Cancer Father    Social History   Social History  . Marital Status: Single    Spouse Name: N/A  . Number of Children: N/A  . Years of Education: N/A   Occupational History  . Not on file.   Social History Main Topics  . Smoking status: Never Smoker   . Smokeless tobacco: Not on file  . Alcohol Use: No  . Drug Use: Not  on file  . Sexual Activity: Not on file   Other Topics Concern  . Not on file   Social History Narrative    Review of Systems  Constitutional: Positive for malaise/fatigue.  All other systems reviewed and are negative.  Objective:   Filed Vitals:   03/21/15 1721  BP: 165/99  Pulse: 106  Temp:   Resp:     Physical Exam  Constitutional: She is oriented to person, place, and time.  Cardiovascular: Normal rate, regular rhythm and normal heart sounds.   Pulmonary/Chest: Effort normal and breath sounds normal.  Abdominal: Soft. Bowel sounds are normal. There is no tenderness.  Neurological: She is alert and oriented to person, place, and time.  Skin: Skin is warm and dry.  Psychiatric: She has a normal mood and affect.     Lab Results  Component Value Date   WBC 9.5  07/07/2014   HGB 9.0* 07/07/2014   HCT 33.0* 07/07/2014   MCV 68.5* 07/07/2014   PLT 398 07/07/2014   Lab Results  Component Value Date   CREATININE 0.62 06/08/2014   BUN 10 06/08/2014   NA 136 06/08/2014   K 3.9 06/08/2014   CL 104 06/08/2014   CO2 26 06/08/2014    No results found for: HGBA1C Lipid Panel  No results found for: CHOL, TRIG, HDL, CHOLHDL, VLDL, LDLCALC     Assessment and plan:   Stacy Harrington was seen today for menstrual problem.  Diagnoses and all orders for this visit:  Irregular periods Will refer to GYN once she gets hospital discount. I have explained to patient that her birth control options are limited due to her weight and tobacco use placing her at greater risk for blood clots.    Iron deficiency anemia -     CBC with Differential Will recheck, want patient to continue taking ferrous sulfate twice daily for now.   Morbid obesity due to excess calories (HCC) -     metFORMIN (GLUCOPHAGE) 500 MG tablet; Take 1 tablet (500 mg total) by mouth daily after supper. Weight loss discussed at length and its complications to health.  Patient will loss 10 pounds by next visit in 3 months.  Diet and exercise discussed as well as calorie intake.  Dental infection -     clindamycin (CLEOCIN) 150 MG capsule; Take 1 capsule (150 mg total) by mouth 3 (three) times daily. -     traMADol (ULTRAM) 50 MG tablet; Take 1 tablet (50 mg total) by mouth every 8 (eight) hours as needed. (Patient taking differently: Take 50 mg by mouth every 8 (eight) hours as needed for moderate pain. )  Return in about 4 weeks (around 04/18/2015) for Nurse Visit-BP check .      Ambrose FinlandValerie A Mansfield Dann, NP-C [facility] [phone] 03/21/2015, 5:28 PM

## 2015-03-21 NOTE — Progress Notes (Signed)
Patient complains of having irregular periods Patient states she was placed on metformin to help and it has But she still get her periods and they are still lasting at least a week Patient also feels she might be anemic because she has been having some SOB Patient also complains of having tooth pain to her right side

## 2015-03-21 NOTE — Patient Instructions (Signed)
Please apply for hospital discount so that I can send you to GYN  I have sent antibiotic and Tramadol for dental pain

## 2015-03-22 ENCOUNTER — Encounter (HOSPITAL_COMMUNITY): Payer: Self-pay | Admitting: Emergency Medicine

## 2015-03-22 ENCOUNTER — Other Ambulatory Visit: Payer: Self-pay | Admitting: Internal Medicine

## 2015-03-22 ENCOUNTER — Emergency Department (HOSPITAL_COMMUNITY)
Admission: EM | Admit: 2015-03-22 | Discharge: 2015-03-22 | Disposition: A | Discharge status: Home / Self Care | Payer: Self-pay | Attending: Emergency Medicine | Admitting: Emergency Medicine

## 2015-03-22 ENCOUNTER — Telehealth: Payer: Self-pay | Admitting: Internal Medicine

## 2015-03-22 DIAGNOSIS — Z720 Tobacco use: Secondary | ICD-10-CM | POA: Insufficient documentation

## 2015-03-22 DIAGNOSIS — D649 Anemia, unspecified: Secondary | ICD-10-CM | POA: Insufficient documentation

## 2015-03-22 DIAGNOSIS — N938 Other specified abnormal uterine and vaginal bleeding: Secondary | ICD-10-CM | POA: Insufficient documentation

## 2015-03-22 DIAGNOSIS — Z3202 Encounter for pregnancy test, result negative: Secondary | ICD-10-CM | POA: Insufficient documentation

## 2015-03-22 DIAGNOSIS — Z79899 Other long term (current) drug therapy: Secondary | ICD-10-CM | POA: Insufficient documentation

## 2015-03-22 DIAGNOSIS — J45901 Unspecified asthma with (acute) exacerbation: Secondary | ICD-10-CM | POA: Insufficient documentation

## 2015-03-22 HISTORY — DX: Personal history of other medical treatment: Z92.89

## 2015-03-22 LAB — CBC WITH DIFFERENTIAL/PLATELET
BASOS ABS: 0 10*3/uL (ref 0.0–0.1)
BASOS ABS: 0 10*3/uL (ref 0.0–0.1)
BASOS PCT: 0 % (ref 0–1)
BASOS PCT: 0 %
Basophils Absolute: 0 10*3/uL (ref 0.0–0.1)
Basophils Relative: 0 %
EOS ABS: 0.1 10*3/uL (ref 0.0–0.7)
EOS ABS: 0.2 10*3/uL (ref 0.0–0.7)
EOS PCT: 2 %
Eosinophils Absolute: 0.2 10*3/uL (ref 0.0–0.7)
Eosinophils Relative: 1 % (ref 0–5)
Eosinophils Relative: 2 %
HCT: 23.1 % — ABNORMAL LOW (ref 36.0–46.0)
HCT: 24.9 % — ABNORMAL LOW (ref 36.0–46.0)
HCT: 26.6 % — ABNORMAL LOW (ref 36.0–46.0)
HEMOGLOBIN: 6 g/dL — AB (ref 12.0–15.0)
HEMOGLOBIN: 7 g/dL — AB (ref 12.0–15.0)
Hemoglobin: 5.9 g/dL — CL (ref 12.0–15.0)
LYMPHS ABS: 3.7 10*3/uL (ref 0.7–4.0)
LYMPHS PCT: 36 %
Lymphocytes Relative: 34 % (ref 12–46)
Lymphocytes Relative: 36 %
Lymphs Abs: 3.2 10*3/uL (ref 0.7–4.0)
Lymphs Abs: 3.1 10*3/uL (ref 0.7–4.0)
MCH: 15.9 pg — ABNORMAL LOW (ref 26.0–34.0)
MCH: 16.2 pg — ABNORMAL LOW (ref 26.0–34.0)
MCH: 18.4 pg — AB (ref 26.0–34.0)
MCHC: 25.5 g/dL — ABNORMAL LOW (ref 30.0–36.0)
MCHC: 24.1 g/dL — ABNORMAL LOW (ref 30.0–36.0)
MCHC: 26.3 g/dL — AB (ref 30.0–36.0)
MCV: 62.4 fL — ABNORMAL LOW (ref 78.0–100.0)
MCV: 67.1 fL — ABNORMAL LOW (ref 78.0–100.0)
MCV: 70 fL — AB (ref 78.0–100.0)
MONO ABS: 0.5 10*3/uL (ref 0.1–1.0)
Monocytes Absolute: 0.8 10*3/uL (ref 0.1–1.0)
Monocytes Absolute: 0.6 10*3/uL (ref 0.1–1.0)
Monocytes Relative: 7 % (ref 3–12)
Monocytes Relative: 7 %
Monocytes Relative: 6 %
NEUTROS ABS: 4.7 10*3/uL (ref 1.7–7.7)
NEUTROS PCT: 58 % (ref 43–77)
NEUTROS PCT: 56 %
Neutro Abs: 6.3 10*3/uL (ref 1.7–7.7)
Neutro Abs: 4.9 10*3/uL (ref 1.7–7.7)
Neutrophils Relative %: 55 %
PLATELETS: 290 10*3/uL (ref 150–400)
Platelets: 272 10*3/uL (ref 150–400)
Platelets: 226 10*3/uL (ref 150–400)
RBC: 3.7 MIL/uL — AB (ref 3.87–5.11)
RBC: 3.71 MIL/uL — AB (ref 3.87–5.11)
RBC: 3.8 MIL/uL — ABNORMAL LOW (ref 3.87–5.11)
RDW: 24 % — ABNORMAL HIGH (ref 11.5–15.5)
RDW: 25.1 % — AB (ref 11.5–15.5)
RDW: 25.7 % — ABNORMAL HIGH (ref 11.5–15.5)
WBC: 10.8 10*3/uL — AB (ref 4.0–10.5)
WBC: 8.9 10*3/uL (ref 4.0–10.5)
WBC: 8.5 10*3/uL (ref 4.0–10.5)

## 2015-03-22 LAB — BASIC METABOLIC PANEL
ANION GAP: 9 (ref 5–15)
BUN: 8 mg/dL (ref 6–20)
CALCIUM: 9.2 mg/dL (ref 8.9–10.3)
CO2: 24 mmol/L (ref 22–32)
Chloride: 105 mmol/L (ref 101–111)
Creatinine, Ser: 0.6 mg/dL (ref 0.44–1.00)
GFR calc non Af Amer: >60 mL/min (ref >60)
Glucose, Bld: 92 mg/dL (ref 65–99)
Potassium: 3.9 mmol/L (ref 3.5–5.1)
SODIUM: 138 mmol/L (ref 135–145)

## 2015-03-22 LAB — POC URINE PREG, ED: PREG TEST UR: NEGATIVE

## 2015-03-22 LAB — PREPARE RBC (CROSSMATCH)

## 2015-03-22 LAB — ABO/RH: ABO/RH(D): A NEG

## 2015-03-22 MED ORDER — TRAMADOL HCL 50 MG PO TABS
50.0000 mg | ORAL_TABLET | Freq: Once | ORAL | Status: AC
  Administered 2015-03-22: 50 mg via ORAL
  Filled 2015-03-22: qty 1

## 2015-03-22 NOTE — ED Notes (Signed)
RN DRAWING LAB

## 2015-03-22 NOTE — Discharge Instructions (Signed)
Abnormal Uterine Bleeding °Abnormal uterine bleeding means bleeding from the vagina that is not your normal menstrual period. This can be: °· Bleeding or spotting between periods. °· Bleeding after sex (sexual intercourse). °· Bleeding that is heavier or more than normal. °· Periods that last longer than usual. °· Bleeding after menopause. °There are many problems that may cause this. Treatment will depend on the cause of the bleeding. Any kind of bleeding that is not normal should be reviewed by your doctor.  °HOME CARE °Watch your condition for any changes. These actions may lessen any discomfort you are having: °· Do not use tampons or douches as told by your doctor. °· Change your pads often. °You should get regular pelvic exams and Pap tests. Keep all appointments for tests as told by your doctor. °GET HELP IF: °· You are bleeding for more than 1 week. °· You feel dizzy at times. °GET HELP RIGHT AWAY IF:  °· You pass out. °· You have to change pads every 15 to 30 minutes. °· You have belly pain. °· You have a fever. °· You become sweaty or weak. °· You are passing large blood clots from the vagina. °· You feel sick to your stomach (nauseous) and throw up (vomit). °MAKE SURE YOU: °· Understand these instructions. °· Will watch your condition. °· Will get help right away if you are not doing well or get worse. °  °This information is not intended to replace advice given to you by your health care provider. Make sure you discuss any questions you have with your health care provider. °  °Document Released: 03/17/2009 Document Revised: 05/25/2013 Document Reviewed: 12/17/2012 °Elsevier Interactive Patient Education ©2016 Elsevier Inc. ° °Anemia, Nonspecific °Anemia is a condition in which the concentration of red blood cells or hemoglobin in the blood is below normal. Hemoglobin is a substance in red blood cells that carries oxygen to the tissues of the body. Anemia results in not enough oxygen reaching these  tissues.  °CAUSES  °Common causes of anemia include:  °· Excessive bleeding. Bleeding may be internal or external. This includes excessive bleeding from periods (in women) or from the intestine.   °· Poor nutrition.   °· Chronic kidney, thyroid, and liver disease.  °· Bone marrow disorders that decrease red blood cell production. °· Cancer and treatments for cancer. °· HIV, AIDS, and their treatments. °· Spleen problems that increase red blood cell destruction. °· Blood disorders. °· Excess destruction of red blood cells due to infection, medicines, and autoimmune disorders. °SIGNS AND SYMPTOMS  °· Minor weakness.   °· Dizziness.   °· Headache. °· Palpitations.   °· Shortness of breath, especially with exercise.   °· Paleness. °· Cold sensitivity. °· Indigestion. °· Nausea. °· Difficulty sleeping. °· Difficulty concentrating. °Symptoms may occur suddenly or they may develop slowly.  °DIAGNOSIS  °Additional blood tests are often needed. These help your health care provider determine the best treatment. Your health care provider will check your stool for blood and look for other causes of blood loss.  °TREATMENT  °Treatment varies depending on the cause of the anemia. Treatment can include:  °· Supplements of iron, vitamin B12, or folic acid.   °· Hormone medicines.   °· A blood transfusion. This may be needed if blood loss is severe.   °· Hospitalization. This may be needed if there is significant continual blood loss.   °· Dietary changes. °· Spleen removal. °HOME CARE INSTRUCTIONS °Keep all follow-up appointments. It often takes many weeks to correct anemia, and having your health care   provider check on your condition and your response to treatment is very important. °SEEK IMMEDIATE MEDICAL CARE IF:  °· You develop extreme weakness, shortness of breath, or chest pain.   °· You become dizzy or have trouble concentrating. °· You develop heavy vaginal bleeding.   °· You develop a rash.   °· You have bloody or black,  tarry stools.   °· You faint.   °· You vomit up blood.   °· You vomit repeatedly.   °· You have abdominal pain. °· You have a fever or persistent symptoms for more than 2-3 days.   °· You have a fever and your symptoms suddenly get worse.   °· You are dehydrated.   °MAKE SURE YOU: °· Understand these instructions. °· Will watch your condition. °· Will get help right away if you are not doing well or get worse. °  °This information is not intended to replace advice given to you by your health care provider. Make sure you discuss any questions you have with your health care provider. °  °Document Released: 06/27/2004 Document Revised: 01/20/2013 Document Reviewed: 11/13/2012 °Elsevier Interactive Patient Education ©2016 Elsevier Inc. ° °

## 2015-03-22 NOTE — ED Notes (Signed)
Pt ambulatory with steady gait - A&Ox4. [facility]. Knows hemoglobin is 7.0 at this point. MD Nugyen in to speak with patient and family regarding further management of current issues. No other c/c. Work note given.

## 2015-03-22 NOTE — Telephone Encounter (Signed)
Received call from nurse line stating that patient had a hemoglobin of 5.9. I called patient and left voicemail to call [facility] for critical labs that will need further attention. I will attempt to call patient back throughout the morning.

## 2015-03-22 NOTE — ED Provider Notes (Signed)
CSN: [id number]     Arrival date & time 03/22/15  1011 History   First MD Initiated Contact with Patient 03/22/15 1110     Chief Complaint  Patient presents with  . Abnormal Lab    5.9 [facility] 03/21/2015     (Consider location/radiation/quality/duration/timing/severity/associated sxs/prior Treatment) HPI Patient reports that she has been getting increasing shortness of breath on exertion and fatigue. She thought her blood count might be getting low. She reports she has had anemia in the past and had to get transfused. She had labs drawn yesterday at her primary care provider and they reported that she is very anemic and needed to come to the emergency department. Patient reports that she typically has very long and irregular menstrual cycles. This is been the cause for her anemia. She reports that she may bleed for 2-3 weeks at a time, and then only have about a week or so without bleeding. When she was younger, the bleeding was heavier with more clots. Since she is an older the bleeding is more prolonged. Schwartz at this time she is only having light bleeding. She denies any pain. She had noted yesterday that she was getting an occasional palpitation. She did not express any syncopal episodes. Past Medical History  Diagnosis Date  . Asthma     uses inhaler  . Anemia   . History of blood transfusion    Past Surgical History  Procedure Laterality Date  . Tonsillectomy    . Cholecystectomy     Family History  Problem Relation Age of Onset  . Asthma Mother   . Hypertension Mother   . Asthma Father   . Diabetes Mellitus II Father   . Cancer Father    Social History  Substance Use Topics  . Smoking status: Current Every Day Smoker -- 0.30 packs/day    Types: Cigarettes  . Smokeless tobacco: None  . Alcohol Use: No   OB History    No data available     Review of Systems 10 Systems reviewed and are negative for acute change except as noted in the  HPI.    Allergies  Aloe vera; Ceclor; and Codeine  Home Medications   Prior to Admission medications   Medication Sig Start Date End Date Taking? Authorizing Provider  acetaminophen (TYLENOL) 500 MG tablet Take 1,000 mg by mouth every 6 (six) hours as needed for moderate pain.   Yes Historical Provider, MD  albuterol (PROVENTIL HFA;VENTOLIN HFA) 108 (90 BASE) MCG/ACT inhaler Inhale 2 puffs into the lungs every 6 (six) hours as needed for wheezing or shortness of breath. 07/20/14  Yes Quentin Angst, MD  albuterol (PROVENTIL) (2.5 MG/3ML) 0.083% nebulizer solution Take 3 mLs (2.5 mg total) by nebulization every 4 (four) hours as needed for wheezing or shortness of breath. 06/08/14  Yes Dorothea Ogle, MD  cetirizine (ZYRTEC) 10 MG tablet Take 10 mg by mouth daily as needed for allergies.   Yes Historical Provider, MD  clindamycin (CLEOCIN) 150 MG capsule Take 1 capsule (150 mg total) by mouth 3 (three) times daily. 03/21/15  Yes Ambrose Finland, NP  Cyanocobalamin (VITAMIN B 12 PO) Take 1 tablet by mouth daily.   Yes Historical Provider, MD  ferrous sulfate 300 (60 FE) MG/5ML syrup Take 5 mLs (300 mg total) by mouth 3 (three) times daily with meals. 06/10/14  Yes Ambrose Finland, NP  guaiFENesin-dextromethorphan (ROBITUSSIN DM) 100-10 MG/5ML syrup Take 5 mLs by mouth every 4 (  four) hours as needed for cough. 06/08/14  Yes Dorothea OgleIskra M Myers, MD  ibuprofen (ADVIL,MOTRIN) 200 MG tablet Take 800 mg by mouth every 6 (six) hours as needed for moderate pain.   Yes Historical Provider, MD  omeprazole (PRILOSEC) 20 MG capsule Take 20 mg by mouth daily as needed (indigestion).    Yes Historical Provider, MD  traMADol (ULTRAM) 50 MG tablet Take 1 tablet (50 mg total) by mouth every 8 (eight) hours as needed. Patient taking differently: Take 50 mg by mouth every 8 (eight) hours as needed for moderate pain.  03/21/15  Yes Ambrose FinlandValerie A Keck, NP  Acetaminophen (TYLENOL PO) Take by mouth.    Historical Provider, MD   Chlorphen-Pseudoephed-APAP (THERAFLU FLU/COLD PO) Take by mouth.    Historical Provider, MD  levofloxacin (LEVAQUIN) 750 MG tablet Take 1 tablet (750 mg total) by mouth daily. Patient not taking: Reported on 07/07/2014 06/08/14   Dorothea OgleIskra M Myers, MD  metFORMIN (GLUCOPHAGE) 500 MG tablet Take 1 tablet (500 mg total) by mouth daily after supper. 03/21/15   Ambrose FinlandValerie A Keck, NP  predniSONE (DELTASONE) 50 MG tablet Take one pill once per day Patient not taking: Reported on 07/07/2014 06/10/14   Ambrose FinlandValerie A Keck, NP   BP 131/66 mmHg  Pulse 90  Temp(Src) 98.2 F (36.8 C) (Oral)  Resp 24  SpO2 100%  LMP 02/15/2015 (Approximate) Physical Exam  Constitutional: She is oriented to person, place, and time. She appears well-developed and well-nourished.  HENT:  Head: Normocephalic and atraumatic.  Eyes: EOM are normal. Pupils are equal, round, and reactive to light.  Neck: Neck supple.  Cardiovascular: Normal rate, regular rhythm, normal heart sounds and intact distal pulses.   Pulmonary/Chest: Effort normal and breath sounds normal.  Abdominal: Soft. Bowel sounds are normal. She exhibits no distension. There is no tenderness.  Musculoskeletal: Normal range of motion. She exhibits no edema.  Neurological: She is alert and oriented to person, place, and time. She has normal strength. Coordination normal. GCS eye subscore is 4. GCS verbal subscore is 5. GCS motor subscore is 6.  Skin: Skin is warm, dry and intact.  Psychiatric: She has a normal mood and affect.    ED Course  Procedures (including critical care time) Labs Review Labs Reviewed  CBC WITH DIFFERENTIAL/PLATELET - Abnormal; Notable for the following:    RBC 3.71 (*)    Hemoglobin 6.0 (*)    HCT 24.9 (*)    MCV 67.1 (*)    MCH 16.2 (*)    MCHC 24.1 (*)    RDW 25.1 (*)    All other components within normal limits  BASIC METABOLIC PANEL  POC URINE PREG, ED  PREPARE RBC (CROSSMATCH)  TYPE AND SCREEN  ABO/RH    Imaging Review No results  found. I have personally reviewed and evaluated these images and lab results as part of my medical decision-making.   EKG Interpretation None      MDM   Final diagnoses:  Symptomatic anemia  Dysfunctional uterine bleeding   Patient is sent from outpatient provider for blood transfusion due to symptomatic anemia. Patient has history of chronic dysfunctional uterine bleeding. She has no associated pain. She does report having tried multiple therapies but typically having irregular or heavy bleeding despite hormone therapy. Patient is counseled to return to her primary provider and to GYN follow-up for continued management of dysfunction uterine bleeding.   Arby BarretteMarcy Lyda Colcord, MD 03/22/15 [phone]

## 2015-03-22 NOTE — ED Notes (Signed)
[facility] Patient states she had labs drawn yesterday, was called this am stating that her hgb was 5 and she needed to come directly to the ER. Patient states she feels SOB with activity. Patient states she was being seen yesterday for follow up to irregular periods. Patient states her menstrual cycle starts and lasts for @ 1 month and will be off for 1 week and come back.

## 2015-03-22 NOTE — ED Notes (Signed)
Charge RN in to speak with patient and husband regarding discharge. MD Pfeiffer in previously to explain pt is to follow up with [facility] for further evaluation and assessment of ongoing issues related to bleeding/blood transfusion need. Pt and family aware she is to have CBC redrawn at 1900 to ensure Hgb is at normal level. No other c/c.

## 2015-03-22 NOTE — Telephone Encounter (Signed)
Pt. Called stating PCP sent her to the ED this morning and pt. Was giving 2 units of blood. PT. Stated that the doctor did not check her hemoglobin after the 2 units of blood and she is going to be discharged today.  Pt. Stated she is still bleeding. Please f/u with pt.

## 2015-03-22 NOTE — Progress Notes (Signed)
Provider got in contact with patient and patient is currently in the ED for her low HGB

## 2015-03-22 NOTE — Progress Notes (Signed)
Call patient back about labs to let her know that her hemoglobin was very low and she needs to go to the hospital for transfusion. Patient reports that she is on her way to hospital now. I have also explained that I will refill her ferrous sulfate and she will need to continue taking medication 3 times per day. Patient verbalized understanding.   Ambrose FinlandValerie A Keck, NP 03/22/2015 10:07 AM

## 2015-03-23 ENCOUNTER — Ambulatory Visit: Payer: Self-pay | Attending: Internal Medicine

## 2015-03-23 DIAGNOSIS — R799 Abnormal finding of blood chemistry, unspecified: Secondary | ICD-10-CM

## 2015-03-23 LAB — TYPE AND SCREEN
ABO/RH(D): A NEG
Antibody Screen: NEGATIVE
UNIT DIVISION: 0
UNIT DIVISION: 0

## 2015-03-24 ENCOUNTER — Encounter: Payer: Self-pay | Admitting: Internal Medicine

## 2015-03-24 LAB — CBC WITH 3 PART DIFFERENTIAL
BASOS ABS: 0 10*3/uL (ref 0.0–0.1)
BASOS PCT: 0 % (ref 0–1)
EOS ABS: 0.3 10*3/uL (ref 0.0–0.7)
Eosinophils Relative: 3 % (ref 0–5)
HCT: 27.6 % — ABNORMAL LOW (ref 36.0–46.0)
Hemoglobin: 7.2 g/dL — ABNORMAL LOW (ref 12.0–15.0)
Lymphocytes Relative: 33 % (ref 12–46)
Lymphs Abs: 3 10*3/uL (ref 0.7–4.0)
MCH: 18 pg — ABNORMAL LOW (ref 26.0–34.0)
MCHC: 26.1 g/dL — ABNORMAL LOW (ref 30.0–36.0)
MCV: 69.2 fL — ABNORMAL LOW (ref 78.0–100.0)
Monocytes Absolute: 0.6 10*3/uL (ref 0.1–1.0)
Monocytes Relative: 7 % (ref 3–12)
NEUTROS PCT: 57 % (ref 43–77)
Neutro Abs: 5.2 10*3/uL (ref 1.7–7.7)
PLATELETS: 288 10*3/uL (ref 150–400)
RBC: 3.99 MIL/uL (ref 3.87–5.11)
RDW: 25.5 % — ABNORMAL HIGH (ref 11.5–15.5)
WBC: 9.1 10*3/uL (ref 4.0–10.5)

## 2015-03-29 ENCOUNTER — Other Ambulatory Visit: Payer: Self-pay | Admitting: Internal Medicine

## 2015-03-29 ENCOUNTER — Telehealth: Payer: Self-pay

## 2015-03-29 DIAGNOSIS — D509 Iron deficiency anemia, unspecified: Secondary | ICD-10-CM

## 2015-03-29 NOTE — Telephone Encounter (Signed)
-----   Message from Ambrose FinlandValerie A Keck, NP sent at 03/29/2015  9:20 AM EDT ----- Hemoglobin only went up slightly. Is she still taking the iron supplement three times per day? If she needs refill please send. If she is taking please have her to schedule a lab visit only in 4 weeks for repeat CBC. I have place future lab order.

## 2015-03-29 NOTE — Telephone Encounter (Signed)
Patient returned phone call, please f/u °

## 2015-03-29 NOTE — Telephone Encounter (Signed)
Nurse called patient, reached voicemail. Left message for patient to call Basilio Meadow with [facility], at [phone].  

## 2015-03-30 MED ORDER — FERROUS SULFATE 300 (60 FE) MG/5ML PO SYRP: 300.0000 mg | ORAL_SOLUTION | Freq: Three times a day (TID) | ORAL | Status: AC

## 2015-03-30 NOTE — Telephone Encounter (Signed)
Pt. returned call. Please f/u °

## 2015-03-30 NOTE — Telephone Encounter (Signed)
Patient returned nurses call, verified date of birth. Patient aware of hemoglobin went up slightly. Patient is taking iron three times daily.  Patient needs refill on iron syrup. Nurse will send medication to pharmacy. Patient aware of needing CBC lab in 4 weeks. Nurse transferred patient to front desk to schedule lab only appointment in 4 weeks.

## 2015-03-30 NOTE — Telephone Encounter (Signed)
Nurse called patient, reached voicemail. Left message for patient to call Stacy Harrington with [facility], at [phone].  

## 2015-03-30 NOTE — Telephone Encounter (Signed)
Nurse called patient, reached voicemail. Left message for patient to call Riely Oetken with [facility], at [phone].  

## 2015-04-06 ENCOUNTER — Ambulatory Visit: Payer: Self-pay | Attending: Internal Medicine

## 2015-04-06 DIAGNOSIS — D509 Iron deficiency anemia, unspecified: Secondary | ICD-10-CM

## 2015-04-06 LAB — CBC
HCT: 23.7 % — ABNORMAL LOW (ref 36.0–46.0)
Hemoglobin: 6.6 g/dL — CL (ref 12.0–15.0)
MCH: 18 pg — ABNORMAL LOW (ref 26.0–34.0)
MCHC: 27.8 g/dL — ABNORMAL LOW (ref 30.0–36.0)
MCV: 64.8 fL — ABNORMAL LOW (ref 78.0–100.0)
PLATELETS: 364 10*3/uL (ref 150–400)
RBC: 3.66 MIL/uL — ABNORMAL LOW (ref 3.87–5.11)
RDW: 25.5 % — ABNORMAL HIGH (ref 11.5–15.5)
WBC: 9.1 10*3/uL (ref 4.0–10.5)

## 2015-04-07 ENCOUNTER — Telehealth: Payer: Self-pay

## 2015-04-07 ENCOUNTER — Other Ambulatory Visit: Payer: Self-pay | Admitting: Internal Medicine

## 2015-04-07 DIAGNOSIS — D649 Anemia, unspecified: Secondary | ICD-10-CM

## 2015-04-07 DIAGNOSIS — N938 Other specified abnormal uterine and vaginal bleeding: Secondary | ICD-10-CM

## 2015-04-07 MED ORDER — NORETHINDRONE 0.35 MG PO TABS: 1.0000 | ORAL_TABLET | Freq: Every day | ORAL | Status: DC

## 2015-04-07 NOTE — Telephone Encounter (Signed)
Call her back and let her know that we can try a birth control pill that does not have estrogen so it will be less chance of blood clots but she really needs to stop smoking ASAP. I have sent pill to pharmacy. When can she get into apply for discount? I will try to send referral for urgent so hopefully they will call her soon to get in even without insurance.

## 2015-04-07 NOTE — Telephone Encounter (Signed)
Spoke with patient this am Patient stated she is taking her iron three times a day but It has been about two months  with her having her period She would like to try birth control but does not want the depo

## 2015-04-11 ENCOUNTER — Other Ambulatory Visit: Payer: Self-pay

## 2015-04-11 DIAGNOSIS — N938 Other specified abnormal uterine and vaginal bleeding: Secondary | ICD-10-CM

## 2015-04-11 NOTE — Progress Notes (Signed)
Per provider, recommendation to have US before appt.  US scheduled for November 16th @0900 .  Information given to Valero EnergyVanessa, Passenger transport manageregistrar.

## 2015-04-17 NOTE — Telephone Encounter (Signed)
-----   Message from Valerie A Keck, NP sent at 04/12/2015  2:09 PM EST ----- Did she ever pick up prescription of birth control pills? 

## 2015-04-17 NOTE — Telephone Encounter (Signed)
-----   Message from Ambrose FinlandValerie A Keck, NP sent at 04/12/2015  2:09 PM EST ----- Did she ever pick up prescription of birth control pills?

## 2015-04-17 NOTE — Telephone Encounter (Signed)
Nurse called patient, patient verified date of birth. Patient reports picking up birth control pills on Friday, November 4, at first pills helped and bleeding was about to stop. Patient started bleeding heavily again Friday, November 11.  Nurse told patient to continue birth control pills to see if they help. Patient has BP check tomorrow with Misty StanleyStacey.   Nurse will send message to provider.

## 2015-04-17 NOTE — Telephone Encounter (Signed)
Nurse called patient, reached voicemail. Left message for patient to call Bentzion Dauria with [facility], at [phone].  

## 2015-04-18 ENCOUNTER — Ambulatory Visit: Payer: Self-pay | Attending: Internal Medicine | Admitting: Pharmacist

## 2015-04-18 VITALS — BP 128/66 | HR 80

## 2015-04-18 DIAGNOSIS — I10 Essential (primary) hypertension: Secondary | ICD-10-CM | POA: Insufficient documentation

## 2015-04-18 DIAGNOSIS — Z0000 Encounter for general adult medical examination without abnormal findings: Secondary | ICD-10-CM

## 2015-04-18 NOTE — Patient Instructions (Signed)
Your blood pressure looks great today!  Follow up with Holland CommonsValerie Keck, NP as directed

## 2015-04-18 NOTE — Progress Notes (Signed)
S:    Patient arrives in good spirits.  Presents to the clinic for blood pressure evaluation.   Patient reports adherence with medications.  Current BP Medications include:  None  She reports that she still has heavy bleeding while on the oral contraceptives.    O:   Last 3 Office BP readings: BP Readings from Last 3 Encounters:  04/18/15 128/66  03/22/15 139/72  03/21/15 147/85    BMET    Component Value Date/Time   NA 138 03/22/2015 1112   K 3.9 03/22/2015 1112   CL 105 03/22/2015 1112   CO2 24 03/22/2015 1112   GLUCOSE 92 03/22/2015 1112   BUN 8 03/22/2015 1112   CREATININE 0.60 03/22/2015 1112   CALCIUM 9.2 03/22/2015 1112   GFRNONAA >60 03/22/2015 1112   GFRAA >60 03/22/2015 1112    A/P: History of hypertension currently controlled on no medications. No recommendations for any changes. Medication reconciliation completed and medication list was updated. Patient [facility] for heavy menstrual bleeding.   Results reviewed and written information provided.  Total time in face-to-face counseling 20 minutes.  F/U Clinic Visit with [facility] CommonsValerie Keck, NP.  Patient seen with Harlow MaresKristen Richardson

## 2015-04-19 ENCOUNTER — Ambulatory Visit (HOSPITAL_COMMUNITY)
Admission: RE | Admit: 2015-04-19 | Discharge: 2015-04-19 | Disposition: A | Discharge status: Home / Self Care | Payer: Self-pay | Source: Ambulatory Visit | Attending: Obstetrics and Gynecology | Admitting: Obstetrics and Gynecology

## 2015-04-19 DIAGNOSIS — D649 Anemia, unspecified: Secondary | ICD-10-CM | POA: Insufficient documentation

## 2015-04-19 DIAGNOSIS — N938 Other specified abnormal uterine and vaginal bleeding: Secondary | ICD-10-CM | POA: Insufficient documentation

## 2015-05-01 ENCOUNTER — Telehealth: Payer: Self-pay | Admitting: Internal Medicine

## 2015-05-01 NOTE — Telephone Encounter (Signed)
Pt calling to express concerns regarding her Birth Control. States that her symptoms are getting worse and she does have her results back from the OB/GYN. Patient states that her cramps are worse as well as her menstrual bleeding. Please advise patient of next steps to take at the earliest convenience. Thank you, Dorothey BasemanSadie Reynolds, ASA

## 2015-05-02 ENCOUNTER — Telehealth: Payer: Self-pay

## 2015-05-02 NOTE — Telephone Encounter (Signed)
Patient is calling denise back. Please follow up with pt. Thank you

## 2015-05-02 NOTE — Telephone Encounter (Signed)
Returned patient phone call Patient not available Left message on voice mail to return our call 

## 2015-05-05 ENCOUNTER — Ambulatory Visit (INDEPENDENT_AMBULATORY_CARE_PROVIDER_SITE_OTHER): Payer: Self-pay | Admitting: Obstetrics and Gynecology

## 2015-05-05 ENCOUNTER — Other Ambulatory Visit (HOSPITAL_COMMUNITY)
Admission: RE | Admit: 2015-05-05 | Discharge: 2015-05-05 | Disposition: A | Discharge status: Home / Self Care | Payer: Self-pay | Source: Ambulatory Visit | Attending: Obstetrics and Gynecology | Admitting: Obstetrics and Gynecology

## 2015-05-05 ENCOUNTER — Encounter: Payer: Self-pay | Admitting: Obstetrics and Gynecology

## 2015-05-05 VITALS — BP 156/86 | HR 115 | Temp 98.9°F | Ht 62.0 in | Wt 383.9 lb

## 2015-05-05 DIAGNOSIS — N938 Other specified abnormal uterine and vaginal bleeding: Secondary | ICD-10-CM

## 2015-05-05 LAB — CBC
HEMATOCRIT: 20.1 % — AB (ref 36.0–46.0)
Hemoglobin: 5.1 g/dL — CL (ref 12.0–15.0)
MCH: 16 pg — AB (ref 26.0–34.0)
MCHC: 25.4 g/dL — AB (ref 30.0–36.0)
MCV: 63 fL — ABNORMAL LOW (ref 78.0–100.0)
Platelets: 383 10*3/uL (ref 150–400)
RBC: 3.19 MIL/uL — ABNORMAL LOW (ref 3.87–5.11)
RDW: 24.3 % — AB (ref 11.5–15.5)
WBC: 11 10*3/uL — AB (ref 4.0–10.5)

## 2015-05-05 LAB — POCT PREGNANCY, URINE: Preg Test, Ur: NEGATIVE

## 2015-05-05 MED ORDER — MEGESTROL ACETATE 40 MG PO TABS: ORAL_TABLET | ORAL | Status: AC

## 2015-05-05 NOTE — Progress Notes (Signed)
Pt given [employer] application and was advised to bring in completed application along with proof of income.  Pt agreed.  Pt brought in completed application with proof of income.  Information faxed to [employer].

## 2015-05-05 NOTE — Patient Instructions (Signed)
Dysfunctional Uterine Bleeding Dysfunctional uterine bleeding is abnormal bleeding from the uterus. Dysfunctional uterine bleeding includes:  A period that comes earlier or later than usual.  A period that is lighter, heavier, or has blood clots.  Bleeding between periods.  Skipping one or more periods.  Bleeding after sexual intercourse.  Bleeding after menopause. HOME CARE INSTRUCTIONS  Pay attention to any changes in your symptoms. Follow these instructions to help with your condition: Eating  Eat well-balanced meals. Include foods that are high in iron, such as liver, meat, shellfish, green leafy vegetables, and eggs.  If you become constipated:  Drink plenty of water.  Eat fruits and vegetables that are high in water and fiber, such as spinach, carrots, raspberries, apples, and mango. Medicines  Take over-the-counter and prescription medicines only as told by your health care provider.  Do not change medicines without talking with your health care provider.  Aspirin or medicines that contain aspirin may make the bleeding worse. Do not take those medicines:  During the week before your period.  During your period.  If you were prescribed iron pills, take them as told by your health care provider. Iron pills help to replace iron that your body loses because of this condition. Activity  If you need to change your sanitary pad or tampon more than one time every 2 hours:  Lie in bed with your feet raised (elevated).  Place a cold pack on your lower abdomen.  Rest as much as possible until the bleeding stops or slows down.  Do not try to lose weight until the bleeding has stopped and your blood iron level is back to normal. Other Instructions  For two months, write down:  When your period starts.  When your period ends.  When any abnormal bleeding occurs.  What problems you notice.  Keep all follow up visits as told by your health care provider. This is  important. SEEK MEDICAL CARE IF:  You get light-headed or weak.  You have nausea and vomiting.  You cannot eat or drink without vomiting.  You feel dizzy or have diarrhea while you are taking medicines.  You are taking birth control pills or hormones, and you want to change them or stop taking them. SEEK IMMEDIATE MEDICAL CARE IF:  You develop a fever or chills.  You need to change your sanitary pad or tampon more than one time per hour.  Your bleeding becomes heavier, or your flow contains clots more often.  You develop pain in your abdomen.  You lose consciousness.  You develop a rash.   This information is not intended to replace advice given to you by your health care provider. Make sure you discuss any questions you have with your health care provider.   Document Released: 05/17/2000 Document Revised: 02/08/2015 Document Reviewed: 08/15/2014 Elsevier Interactive Patient Education 2016 [employer] Inc[employer]   Endometrial Biopsy, Care After Refer to this sheet in the next few weeks. These instructions provide you with information on caring for yourself after your procedure. Your health care provider may also give you more specific instructions. Your treatment has been planned according to current medical practices, but problems sometimes occur. Call your health care provider if you have any problems or questions after your procedure. WHAT TO EXPECT AFTER THE PROCEDURE After your procedure, it is typical to have the following:  You may have mild cramping and a small amount of vaginal bleeding for a few days after the procedure. This is normal. HOME CARE INSTRUCTIONS  Only take over-the-counter or prescription medicine as directed by your health care provider.  Do not douche, use tampons, or have sexual intercourse until your health care provider approves.  Follow your health care provider's instructions regarding any activity restrictions, such as strenuous exercise or heavy  lifting. SEEK MEDICAL CARE IF:  You have heavy bleeding or bleeding longer than 2 days after the procedure.  You have bad smelling drainage from your vagina.  You have a fever and chills.  Youhave severe lower stomach (abdominal) pain. SEEK IMMEDIATE MEDICAL CARE IF:  You have severe cramps in your stomach or back.  You pass large blood clots.  Your bleeding increases.  You become weak or lightheaded, or you pass out.   This information is not intended to replace advice given to you by your health care provider. Make sure you discuss any questions you have with your health care provider.   Document Released: 03/10/2013 Document Reviewed: 03/10/2013 Elsevier Interactive Patient Education 2016 [employer]  Calorie Counting for [employer] Calories are energy you get from the things you eat and drink. Your body uses this energy to keep you going throughout the day. The number of calories you eat affects your weight. When you eat more calories than your body needs, your body stores the extra calories as fat. When you eat fewer calories than your body needs, your body burns fat to get the energy it needs. Calorie counting means keeping track of how many calories you eat and drink each day. If you make sure to eat fewer calories than your body needs, you should lose weight. In order for calorie counting to work, you will need to eat the number of calories that are right for you in a day to lose a healthy amount of weight per week. A healthy amount of weight to lose per week is usually 1-2 lb (0.5-0.9 kg). A dietitian can determine how many calories you need in a day and give you suggestions on how to reach your calorie goal.  WHAT IS MY MY PLAN? My goal is to have __________ calories per day.  If I have this many calories per day, I should lose around __________ pounds per week. WHAT DO I NEED TO KNOW ABOUT CALORIE COUNTING? In order to meet your daily calorie goal, you will need  to:  Find out how many calories are in each food you would like to eat. Try to do this before you eat.  Decide how much of the food you can eat.  Write down what you ate and how many calories it had. Doing this is called keeping a food log. WHERE DO I FIND CALORIE INFORMATION? The number of calories in a food can be found on a Nutrition Facts label. Note that all the information on a label is based on a specific serving of the food. If a food does not have a Nutrition Facts label, try to look up the calories online or ask your dietitian for help. HOW DO I DECIDE HOW MUCH TO EAT? To decide how much of the food you can eat, you will need to consider both the number of calories in one serving and the size of one serving. This information can be found on the Nutrition Facts label. If a food does not have a Nutrition Facts label, look up the information online or ask your dietitian for help. Remember that calories are listed per serving. If you choose to have more than one serving of a  food, you will have to multiply the calories per serving by the amount of servings you plan to eat. For example, the label on a package of bread might say that a serving size is 1 slice and that there are 90 calories in a serving. If you eat 1 slice, you will have eaten 90 calories. If you eat 2 slices, you will have eaten 180 calories. HOW DO I KEEP A FOOD LOG? After each meal, record the following information in your food log:  What you ate.  How much of it you ate.  How many calories it had.  Then, add up your calories. Keep your food log near you, such as in a small notebook in your pocket. Another option is to use a mobile app or website. Some programs will calculate calories for you and show you how many calories you have left each time you add an item to the log. WHAT ARE SOME CALORIE COUNTING TIPS?  Use your calories on foods and drinks that will fill you up and not leave you hungry. Some examples of this  include foods like nuts and nut butters, vegetables, lean proteins, and high-fiber foods (more than 5 g fiber per serving).  Eat nutritious foods and avoid empty calories. Empty calories are calories you get from foods or beverages that do not have many nutrients, such as candy and soda. It is better to have a nutritious high-calorie food (such as an avocado) than a food with few nutrients (such as a bag of chips).  Know how many calories are in the foods you eat most often. This way, you do not have to look up how many calories they have each time you eat them.  Look out for foods that may seem like low-calorie foods but are really high-calorie foods, such as baked goods, soda, and fat-free candy.  Pay attention to calories in drinks. Drinks such as sodas, specialty coffee drinks, alcohol, and juices have a lot of calories yet do not fill you up. Choose low-calorie drinks like water and diet drinks.  Focus your calorie counting efforts on higher calorie items. Logging the calories in a garden salad that contains only vegetables is less important than calculating the calories in a milk shake.  Find a way of tracking calories that works for you. Get creative. Most people who are successful find ways to keep track of how much they eat in a day, even if they do not count every calorie. WHAT ARE SOME PORTION CONTROL TIPS?  Know how many calories are in a serving. This will help you know how many servings of a certain food you can have.  Use a measuring cup to measure serving sizes. This is helpful when you start out. With time, you will be able to estimate serving sizes for some foods.  Take some time to put servings of different foods on your favorite plates, bowls, and cups so you know what a serving looks like.  Try not to eat straight from a bag or box. Doing this can lead to overeating. Put the amount you would like to eat in a cup or on a plate to make sure you are eating the right  portion.  Use smaller plates, glasses, and bowls to prevent overeating. This is a quick and easy way to practice portion control. If your plate is smaller, less food can fit on it.  Try not to multitask while eating, such as watching TV or using your computer. If it  is time to eat, sit down at a table and enjoy your food. Doing this will help you to start recognizing when you are full. It will also make you more aware of what and how much you are eating. HOW CAN I CALORIE COUNT WHEN EATING OUT?  Ask for smaller portion sizes or child-sized portions.  Consider sharing an entree and sides instead of getting your own entree.  If you get your own entree, eat only half. Ask for a box at the beginning of your meal and put the rest of your entree in it so you are not tempted to eat it.  Look for the calories on the menu. If calories are listed, choose the lower calorie options.  Choose dishes that include vegetables, fruits, whole grains, low-fat dairy products, and lean protein. Focusing on smart food choices from each of the 5 food groups can help you stay on track at restaurants.  Choose items that are boiled, broiled, grilled, or steamed.  Choose water, milk, unsweetened iced tea, or other drinks without added sugars. If you want an alcoholic beverage, choose a lower calorie option. For example, a regular margarita can have up to 700 calories and a glass of wine has around 150.  Stay away from items that are buttered, battered, fried, or served with cream sauce. Items labeled "crispy" are usually fried, unless stated otherwise.  Ask for dressings, sauces, and syrups on the side. These are usually very high in calories, so do not eat much of them.  Watch out for salads. Many people think salads are a healthy option, but this is often not the case. Many salads come with bacon, fried chicken, lots of cheese, fried chips, and dressing. All of these items have a lot of calories. If you want a salad,  choose a garden salad and ask for grilled meats or steak. Ask for the dressing on the side, or ask for olive oil and vinegar or lemon to use as dressing.  Estimate how many servings of a food you are given. For example, a serving of cooked rice is  cup or about the size of half a tennis ball or one cupcake wrapper. Knowing serving sizes will help you be aware of how much food you are eating at restaurants. The list below tells you how big or small some common portion sizes are based on everyday objects.  1 oz--4 stacked dice.  3 oz--1 deck of cards.  1 tsp--1 dice.  1 Tbsp-- a Ping-Pong ball.  2 Tbsp--1 Ping-Pong ball.   cup--1 tennis ball or 1 cupcake wrapper.  1 cup--1 baseball.   This information is not intended to replace advice given to you by your health care provider. Make sure you discuss any questions you have with your health care provider.   Document Released: 05/20/2005 Document Revised: 06/10/2014 Document Reviewed: 03/25/2013 Elsevier Interactive Patient Education 2016 [employer].  Exercising to [employer] Exercising can help you to lose weight. In order to lose weight through exercise, you need to do vigorous-intensity exercise. You can tell that you are exercising with vigorous intensity if you are breathing very hard and fast and cannot hold a conversation while exercising. Moderate-intensity exercise helps to maintain your current weight. You can tell that you are exercising at a moderate level if you have a higher heart rate and faster breathing, but you are still able to hold a conversation. HOW OFTEN SHOULD I EXERCISE? Choose an activity that you enjoy and set realistic goals.  Your health care provider can help you to make an activity plan that works for you. Exercise regularly as directed by your health care provider. This may include:  Doing resistance training twice each week, such as:  Push-ups.  Sit-ups.  Lifting weights.  Using resistance  bands.  Doing a given intensity of exercise for a given amount of time. Choose from these options:  150 minutes of moderate-intensity exercise every week.  75 minutes of vigorous-intensity exercise every week.  A mix of moderate-intensity and vigorous-intensity exercise every week. Children, pregnant women, people who are out of shape, people who are overweight, and older adults may need to consult a health care provider for individual recommendations. If you have any sort of medical condition, be sure to consult your health care provider before starting a new exercise program. WHAT ARE SOME ACTIVITIES THAT CAN HELP ME TO LOSE WEIGHT?   Walking at a rate of at least 4.5 miles an hour.  Jogging or running at a rate of 5 miles per hour.  Biking at a rate of at least 10 miles per hour.  Lap swimming.  Roller-skating or in-line skating.  Cross-country skiing.  Vigorous competitive sports, such as football, basketball, and soccer.  Jumping rope.  Aerobic dancing. HOW CAN I BE MORE ACTIVE IN MY DAY-TO-DAY ACTIVITIES?  Use the stairs instead of the elevator.  Take a walk during your lunch break.  If you drive, park your car farther away from work or school.  If you take public transportation, get off one stop early and walk the rest of the way.  Make all of your phone calls while standing up and walking around.  Get up, stretch, and walk around every 30 minutes throughout the day. WHAT GUIDELINES SHOULD I FOLLOW WHILE EXERCISING?  Do not exercise so much that you hurt yourself, feel dizzy, or get very short of breath.  Consult your health care provider prior to starting a new exercise program.  Wear comfortable clothes and shoes with good support.  Drink plenty of water while you exercise to prevent dehydration or heat stroke. Body water is lost during exercise and must be replaced.  Work out until you breathe faster and your heart beats faster.   This information is  not intended to replace advice given to you by your health care provider. Make sure you discuss any questions you have with your health care provider.   Document Released: 06/22/2010 Document Revised: 06/10/2014 Document Reviewed: 10/21/2013 Elsevier Interactive Patient Education [employer]

## 2015-05-05 NOTE — Progress Notes (Signed)
Patient ID: Stacy Harrington, female   DOB: 02/04/1986, 29 y.o.   MRN: [id number] 29 yo G0 here for the evaluation of DUB. Patient with onset of vaginal bleeding on 02/19/2015 and reports persistent bleeding since that time. She was given a Rx Micronor by her PCP with minimal improvement in her vaginal bleeding. She stopped taking it 2 days ago. She describes her vaginal bleeding as heavy with occasional passage of clots. 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 Menarche occurred at 29 years of age. Her periods were often irregular such that she would have 2-3 cycles per year. In January she started having regular monthly cycles which lasted 3 weeks until this recent prolonged bleeding episode. She denies feeling dizzy, lightheaded. She does report some fatigue. 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 She would like to preserve her fertility but is ready for an intervention  Past Medical History  Diagnosis Date  . Asthma     uses inhaler  . Anemia   . History of blood transfusion    Past Surgical History  Procedure Laterality Date  . Tonsillectomy    . Cholecystectomy     Family History  Problem Relation Age of Onset  . Asthma Mother   . Hypertension Mother   . Asthma Father   . Diabetes Mellitus II Father   . Cancer Father    Social History  Substance Use Topics  . Smoking status: Current Every Day Smoker -- 0.30 packs/day    Types: Cigarettes  . Smokeless tobacco: None  . Alcohol Use: No   ROS See pertinent in HPI  Blood pressure 156/86, pulse 115, temperature 98.9 F (37.2 C), temperature source Oral, height  (1.575 m), weight 383 lb 14.4 oz (174.136 kg), last menstrual period 02/18/2015. GENERAL: Well-developed, well-nourished female in no acute distress. Obese HEENT: Normocephalic, atraumatic. Sclerae anicteric.  ABDOMEN: Soft, nontender, nondistended. Limited secondary to body habitus EXTREMITIES: No cyanosis, clubbing, or edema, 2+  distal pulses.  04/19/2015 Ultrasound FINDINGS: Uterus  Measurements: 8.3 x 4.0 x 4.2 cm. The uterine myometrial echotexture is normal. There are no discrete fibroids.  Endometrium  Thickness: 14.1 mm in thickness. The patient is activelybleeding with motion within the endometrium observed.  Right ovary  Measurements: 3.2 x 2.0 x 2.5 cm. Normal appearance/no adnexal mass.  Left ovary  Measurements: 3.1 x 2.3 x 2.5 cm. Normal appearance/no adnexal mass.  Other findings  There is no free pelvic fluid.  IMPRESSION: 1. The patient is actively bleeding. The endometrium is thickened to 14.1 mm. No fibroids are observed. If bleeding remains unresponsive to hormonal or medical therapy, focal lesion work-up with sonohysterogram should be considered. Endometrial biopsy should also be considered in pre-menopausal patients at high risk for endometrial carcinoma. (Ref: Radiological Reasoning: Algorithmic Workup of Abnormal Vaginal Bleeding with Endovaginal Sonography and Sonohysterography. AJR 2008; 409:W11-91) 2. The ovaries are unremarkable. No adnexal masses are observed. There is no free pelvic fluid.   A/P 29 yo here with DUB - Reviewed ultrasound report with the patient - Discussed the need for an endometrial biopsy ENDOMETRIAL BIOPSY     The indications for endometrial biopsy were reviewed.   Risks of the biopsy including cramping, bleeding, infection, uterine perforation, inadequate specimen and need for additional procedures  were discussed. The patient states she understands and agrees to undergo procedure today. Consent was signed. Time out was performed. Urine HCG was negative. A sterile speculum was placed in  the patient's vagina and the cervix was prepped with Betadine. A single-toothed tenaculum was placed on the anterior lip of the cervix to stabilize it. The uterine cavity was sounded to a depth of 8 cm using the uterine sound. The 3 mm pipelle was  introduced into the endometrial cavity without difficulty, 2 passes were made.  A  moderate amount of tissue was  sent to pathology. The instruments were removed from the patient's vagina. Minimal bleeding from the cervix was noted. The patient tolerated the procedure well.  Routine post-procedure instructions were given to the patient. The patient will follow up in two weeks to review the results and for further management.   - Discussed medical management with contraceptive options such as Mirena IUD. Patient is interested in that option. [employer] form provided. - Rx Megace provided until Mirena placement - Discussed weight loss with increased physical activity (150 minutes of moderate cardio/week) and diet - Patient will be contacted with any abnormal results - RTC for Mirena IUD placement

## 2015-05-08 ENCOUNTER — Other Ambulatory Visit: Payer: Self-pay | Admitting: Obstetrics and Gynecology

## 2015-05-08 ENCOUNTER — Telehealth: Payer: Self-pay

## 2015-05-08 ENCOUNTER — Encounter (HOSPITAL_COMMUNITY): Payer: Self-pay | Admitting: Anesthesiology

## 2015-05-08 ENCOUNTER — Telehealth: Payer: Self-pay | Admitting: General Practice

## 2015-05-08 ENCOUNTER — Encounter (HOSPITAL_COMMUNITY): Payer: Self-pay | Admitting: *Deleted

## 2015-05-08 ENCOUNTER — Inpatient Hospital Stay (HOSPITAL_COMMUNITY)
Admission: AD | Admit: 2015-05-08 | Discharge: 2015-05-09 | DRG: 812 | Disposition: A | Discharge status: Home / Self Care | Payer: Self-pay | Source: Ambulatory Visit | Attending: Obstetrics and Gynecology | Admitting: Obstetrics and Gynecology

## 2015-05-08 DIAGNOSIS — N938 Other specified abnormal uterine and vaginal bleeding: Secondary | ICD-10-CM

## 2015-05-08 DIAGNOSIS — F17210 Nicotine dependence, cigarettes, uncomplicated: Secondary | ICD-10-CM | POA: Diagnosis present

## 2015-05-08 DIAGNOSIS — D500 Iron deficiency anemia secondary to blood loss (chronic): Principal | ICD-10-CM | POA: Diagnosis present

## 2015-05-08 DIAGNOSIS — D649 Anemia, unspecified: Secondary | ICD-10-CM | POA: Diagnosis present

## 2015-05-08 HISTORY — DX: Pneumonia, unspecified organism: J18.9

## 2015-05-08 LAB — PREPARE RBC (CROSSMATCH)

## 2015-05-08 LAB — ABO/RH: ABO/RH(D): A NEG

## 2015-05-08 MED ORDER — DIPHENHYDRAMINE HCL 25 MG PO CAPS
25.0000 mg | ORAL_CAPSULE | Freq: Once | ORAL | Status: AC
  Administered 2015-05-08: 25 mg via ORAL
  Filled 2015-05-08: qty 1

## 2015-05-08 MED ORDER — PRENATAL MULTIVITAMIN CH
1.0000 | ORAL_TABLET | Freq: Every day | ORAL | Status: DC
  Administered 2015-05-08: 1 via ORAL
  Filled 2015-05-08: qty 1

## 2015-05-08 MED ORDER — SODIUM CHLORIDE 0.9 % IV SOLN
Freq: Once | INTRAVENOUS | Status: AC
  Administered 2015-05-08: 13:00:00 via INTRAVENOUS

## 2015-05-08 MED ORDER — ACETAMINOPHEN 325 MG PO TABS
650.0000 mg | ORAL_TABLET | Freq: Once | ORAL | Status: AC
  Administered 2015-05-08: 650 mg via ORAL
  Filled 2015-05-08: qty 2

## 2015-05-08 NOTE — Telephone Encounter (Signed)
Per Dr Jolayne Pantheronstant, patient is anemic. If she is symptomatic (dizzy, lightheaded, chest pain, shortness of breath, unable to complete daily tasks secondary to fatigue), she should come to the hospital for a blood transfusion. Otherwise, she should continue taking iron supplements 3 times daily. Called patient, no answer- left message stating we are trying to reach you with non urgent results, please call us back at the clinics

## 2015-05-08 NOTE — Telephone Encounter (Signed)
Pt returned call and I informed her that she is anemic.  I asked pt if she was having any symptoms.  Pt stated that she is feeling fatigue, dizziness, and sometimes shortness of breath.  I advised pt that per Dr.Constant recommendation pt should go to [facility] for a blood transfusion.  Pt stated understanding with no further questions.

## 2015-05-08 NOTE — Telephone Encounter (Signed)
Returned patient phone call Patient not available Message left on voice mail to return our call 

## 2015-05-08 NOTE — H&P (Signed)
Stacy Harrington is an 29 y.o. female. No obstetric history on file. CC: DUB and anemia for transfusion 29 yo G0 here for the evaluation of DUB. Patient with onset of vaginal bleeding on 02/19/2015 and reports persistent bleeding since that time. She was given a Rx Micronor by her PCP with minimal improvement in her vaginal bleeding. She stopped taking it 2 days ago. She describes her vaginal bleeding as heavy with occasional passage of clots. 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 Menarche occurred at 29 years of age. Her periods were often irregular such that she would have 2-3 cycles per year. In January she started having regular monthly cycles which lasted 3 weeks until this recent prolonged bleeding episode. She denies feeling dizzy, lightheaded. She does report some fatigue. 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 She would like to preserve her fertility but is ready for an intervention. She was severely anemic on 12/2 and is admitted for transfusion on Dr Deretha Emory order   Pertinent Gynecological History:  Bleeding: dysfunctional uterine bleeding Contraception: oral progesterone-only contraceptive DES exposure: denies Blood transfusions: none Sexually transmitted diseases: no past history       Menstrual History:  Patient's last menstrual period was 02/18/2015 (approximate).  no bleeding today  Past Medical History  Diagnosis Date  . Asthma     uses inhaler  . Anemia   . History of blood transfusion   . Pneumonia     at [facility] long  jan 2016    Past Surgical History  Procedure Laterality Date  . Tonsillectomy    . Cholecystectomy      Family History  Problem Relation Age of Onset  . Asthma Mother   . Hypertension Mother   . Asthma Father   . Diabetes Mellitus II Father   . Cancer Father     Social History:  reports that she has been smoking Cigarettes.  She has been smoking about 0.30 packs per day. She does not have any  smokeless tobacco history on file. She reports that she does not drink alcohol or use illicit drugs.  Allergies:  Allergies  Allergen Reactions  . Aloe Vera Hives  . Ceclor [Cefaclor] Hives    Per mother when she was young  . Codeine Other (See Comments)    Hallucinations.      Prescriptions prior to admission  Medication Sig Dispense Refill Last Dose  . albuterol (PROVENTIL HFA;VENTOLIN HFA) 108 (90 BASE) MCG/ACT inhaler Inhale 2 puffs into the lungs every 6 (six) hours as needed for wheezing or shortness of breath. 3 Inhaler 3 05/07/2015 at Unknown time  . cetirizine (ZYRTEC) 10 MG tablet Take 10 mg by mouth daily as needed for allergies.   05/07/2015 at Unknown time  . Cyanocobalamin (VITAMIN B 12 PO) Take 1 tablet by mouth daily.   Past Week at Unknown time  . ferrous sulfate 300 (60 FE) MG/5ML syrup Take 5 mLs (300 mg total) by mouth 3 (three) times daily with meals. 150 mL 3 05/07/2015 at Unknown time  . megestrol (MEGACE) 40 MG tablet Take 1 tablet (40 mg total) by mouth daily. May take up to 3 tablets per day 90 tablet 3 05/07/2015 at Unknown time  . Multiple Vitamin (MULTIVITAMIN WITH MINERALS) TABS tablet Take 1 tablet by mouth daily.   05/07/2015 at Unknown time  . traMADol (ULTRAM) 50 MG tablet Take 1 tablet (50 mg total) by mouth every 8 (eight) hours  as needed. (Patient taking differently: Take 50 mg by mouth every 8 (eight) hours as needed for moderate pain. ) 30 tablet 1 05/07/2015 at Unknown time  . acetaminophen (TYLENOL) 500 MG tablet Take 1,000 mg by mouth every 6 (six) hours as needed for moderate pain.   prn  . albuterol (PROVENTIL) (2.5 MG/3ML) 0.083% nebulizer solution Take 3 mLs (2.5 mg total) by nebulization every 4 (four) hours as needed for wheezing or shortness of breath. 75 mL 12 prn  . ibuprofen (ADVIL,MOTRIN) 200 MG tablet Take 800 mg by mouth every 6 (six) hours as needed for moderate pain.   prn  . metFORMIN (GLUCOPHAGE) 500 MG tablet Take 1 tablet (500 mg total)  by mouth daily after supper. (Patient not taking: Reported on 05/08/2015) 60 tablet 3 Not Taking at Unknown time  . norethindrone (MICRONOR,CAMILA,ERRIN) 0.35 MG tablet Take 1 tablet (0.35 mg total) by mouth daily. (Patient not taking: Reported on 05/05/2015) 1 Package 2 Not Taking at Unknown time    Review of Systems  Constitutional: Positive for malaise/fatigue.  Respiratory: Negative.   Cardiovascular: Negative.   Genitourinary: Negative.     Blood pressure 115/67, pulse 95, temperature 98.7 F (37.1 C), temperature source Axillary, resp. rate 18, height 5\' 2"  (1.575 m), weight 173.728 kg (383 lb), last menstrual period 02/18/2015, SpO2 100 %. Physical Exam  Constitutional: She is oriented to person, place, and time. She appears well-developed. No distress.  obese  Neck: Normal range of motion.  Cardiovascular: Normal rate.   Respiratory: Effort normal. No respiratory distress.  Neurological: She is alert and oriented to person, place, and time.  Skin: Skin is warm and dry.  Psychiatric: She has a normal mood and affect. Her behavior is normal.    Results for orders placed or performed during the hospital encounter of 05/08/15 (from the past 24 hour(s))  Prepare RBC     Status: None   Collection Time: 05/08/15 12:00 PM  Result Value Ref Range   Order Confirmation ORDER PROCESSED BY BLOOD BANK   ABO/Rh     Status: None   Collection Time: 05/08/15 12:07 PM  Result Value Ref Range   ABO/RH(D) A NEG   Type and screen     Status: None (Preliminary result)   Collection Time: 05/08/15 12:18 PM  Result Value Ref Range   ABO/RH(D) A NEG    Antibody Screen NEG    Sample Expiration 05/11/2015    Unit Number [id number]    Blood Component Type RED CELLS,LR    Unit division 00    Status of Unit ALLOCATED    Transfusion Status OK TO TRANSFUSE    Crossmatch Result Compatible    Unit Number [id number]    Blood Component Type RED CELLS,LR    Unit division 00    Status of Unit  ISSUED    Transfusion Status OK TO TRANSFUSE    Crossmatch Result Compatible    Unit Number [id number]    Blood Component Type RBC LR PHER2    Unit division 00    Status of Unit ALLOCATED    Transfusion Status OK TO TRANSFUSE    Crossmatch Result Compatible    CBC    Component Value Date/Time   WBC 11.0* 05/05/2015 0849   RBC 3.19* 05/05/2015 0849   HGB 5.1* 05/05/2015 0849   HCT 20.1* 05/05/2015 0849   PLT 383 05/05/2015 0849   MCV 63.0* 05/05/2015 0849   MCH 16.0* 05/05/2015 0849   MCHC 25.4* 05/05/2015 0849  RDW 24.3* 05/05/2015 0849   LYMPHSABS 3.0 03/23/2015 1143   MONOABS 0.6 03/23/2015 1143   EOSABS 0.3 03/23/2015 1143   BASOSABS 0.0 03/23/2015 1143    CLINICAL DATA: Morbid obesity, dysfunctional uterine bleeding for the past 2 months, anemia. Onset of last normal menstrual period on February 19, 2015 with persistent bleeding.  EXAM: TRANSABDOMINAL AND TRANSVAGINAL ULTRASOUND OF PELVIS  TECHNIQUE: Both transabdominal and transvaginal ultrasound examinations of the pelvis were performed. Transabdominal technique was performed for global imaging of the pelvis including uterus, ovaries, adnexal regions, and pelvic cul-de-sac. It was necessary to proceed with endovaginal exam following the transabdominal exam to visualize the uterus and ovaries.  COMPARISON: 8.3 x 4.0 x 4.2 cm  FINDINGS: Uterus  Measurements: 8.3 x 4.0 x 4.2 cm. The uterine myometrial echotexture is normal. There are no discrete fibroids.  Endometrium  Thickness: 14.1 mm in thickness. The patient is activelybleeding with motion within the endometrium observed.  Right ovary  Measurements: 3.2 x 2.0 x 2.5 cm. Normal appearance/no adnexal mass.  Left ovary  Measurements: 3.1 x 2.3 x 2.5 cm. Normal appearance/no adnexal mass.  Other findings  There is no free pelvic fluid.  IMPRESSION: 1. The patient is actively bleeding. The endometrium is thickened to 14.1  mm. No fibroids are observed. If bleeding remains unresponsive to hormonal or medical therapy, focal lesion work-up with sonohysterogram should be considered. Endometrial biopsy should also be considered in pre-menopausal patients at high risk for endometrial carcinoma. (Ref: Radiological Reasoning: Algorithmic Workup of Abnormal Vaginal Bleeding with Endovaginal Sonography and Sonohysterography. AJR 2008; 098:J19-14) 2. The ovaries are unremarkable. No adnexal masses are observed. There is no free pelvic fluid.   Electronically Signed  By: David Swaziland M.D.  On: 04/19/2015 10:09  Assessment/Plan: DUB- responded to Megace as Rx Severe anemia secondary to vaginal bleeding-for transfusion 3 units PRBC today  ARNOLD,JAMES 05/08/2015, 7:17 PM

## 2015-05-09 DIAGNOSIS — D649 Anemia, unspecified: Secondary | ICD-10-CM

## 2015-05-09 LAB — TYPE AND SCREEN
ABO/RH(D): A NEG
ANTIBODY SCREEN: NEGATIVE
UNIT DIVISION: 0
UNIT DIVISION: 0
Unit division: 0

## 2015-05-09 LAB — HEMOGLOBIN AND HEMATOCRIT, BLOOD
HEMATOCRIT: 25.3 % — AB (ref 36.0–46.0)
HEMOGLOBIN: 7.2 g/dL — AB (ref 12.0–15.0)

## 2015-05-09 NOTE — Discharge Summary (Signed)
Physician Discharge Summary  Patient ID: Stacy Harrington MRN: [id number] DOB/AGE: [birth year] 31 y.o.  Admit date: 05/08/2015 Discharge date: 05/09/2015  Admission Diagnoses:Severe anemia, DUB  Discharge Diagnoses: same Active Problems:   Symptomatic anemia   Discharged Condition: good  Hospital Course:  Stacy Harrington is an 29 y.o. female. No obstetric history on file. CC: DUB and anemia for transfusion 29 yo G0 here for the evaluation of DUB. Patient with onset of vaginal bleeding on 02/19/2015 and reports persistent bleeding since that time. She was given a Rx Micronor by her PCP with minimal improvement in her vaginal bleeding. She stopped taking it 2 days ago. She describes her vaginal bleeding as heavy with occasional passage of clots. 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 Menarche occurred at 29 years of age. Her periods were often irregular such that she would have 2-3 cycles per year. In January she started having regular monthly cycles which lasted 3 weeks until this recent prolonged bleeding episode. She denies feeling dizzy, lightheaded. She does report some fatigue. 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 She would like to preserve her fertility but is ready for an intervention. She was severely anemic on 12/2 and is admitted for transfusion on Dr Deretha Emory order   Pertinent Gynecological History:  Bleeding: dysfunctional uterine bleeding Contraception: oral progesterone-only contraceptive DES exposure: denies Blood transfusions: none Sexually transmitted diseases: no past history      Menstrual History:  Patient's last menstrual period was 02/18/2015 (approximate).  no bleeding today  Past Medical History  Diagnosis Date  . Asthma     uses inhaler  . Anemia   . History of blood transfusion   . Pneumonia     at [facility] long jan 2016    Past Surgical History  Procedure  Laterality Date  . Tonsillectomy    . Cholecystectomy      Family History  Problem Relation Age of Onset  . Asthma Mother   . Hypertension Mother   . Asthma Father   . Diabetes Mellitus II Father   . Cancer Father     Social History:  reports that she has been smoking Cigarettes. She has been smoking about 0.30 packs per day. She does not have any smokeless tobacco history on file. She reports that she does not drink alcohol or use illicit drugs.  Allergies:  Allergies  Allergen Reactions  . Aloe Vera Hives  . Ceclor [Cefaclor] Hives    Per mother when she was young  . Codeine Other (See Comments)    Hallucinations.     Prescriptions prior to admission  Medication Sig Dispense Refill Last Dose  . albuterol (PROVENTIL HFA;VENTOLIN HFA) 108 (90 BASE) MCG/ACT inhaler Inhale 2 puffs into the lungs every 6 (six) hours as needed for wheezing or shortness of breath. 3 Inhaler 3 05/07/2015 at Unknown time  . cetirizine (ZYRTEC) 10 MG tablet Take 10 mg by mouth daily as needed for allergies.   05/07/2015 at Unknown time  . Cyanocobalamin (VITAMIN B 12 PO) Take 1 tablet by mouth daily.   Past Week at Unknown time  . ferrous sulfate 300 (60 FE) MG/5ML syrup Take 5 mLs (300 mg total) by mouth 3 (three) times daily with meals. 150 mL 3 05/07/2015 at Unknown time  . megestrol (MEGACE) 40 MG tablet Take 1 tablet (40 mg total) by mouth daily. May take up to 3 tablets per day 90 tablet 3 05/07/2015 at  Unknown time  . Multiple Vitamin (MULTIVITAMIN WITH MINERALS) TABS tablet Take 1 tablet by mouth daily.   05/07/2015 at Unknown time  . traMADol (ULTRAM) 50 MG tablet Take 1 tablet (50 mg total) by mouth every 8 (eight) hours as needed. (Patient taking differently: Take 50 mg by mouth every 8 (eight) hours as needed for moderate pain. ) 30 tablet 1 05/07/2015 at Unknown time  . acetaminophen (TYLENOL)  500 MG tablet Take 1,000 mg by mouth every 6 (six) hours as needed for moderate pain.   prn  . albuterol (PROVENTIL) (2.5 MG/3ML) 0.083% nebulizer solution Take 3 mLs (2.5 mg total) by nebulization every 4 (four) hours as needed for wheezing or shortness of breath. 75 mL 12 prn  . ibuprofen (ADVIL,MOTRIN) 200 MG tablet Take 800 mg by mouth every 6 (six) hours as needed for moderate pain.   prn  . metFORMIN (GLUCOPHAGE) 500 MG tablet Take 1 tablet (500 mg total) by mouth daily after supper. (Patient not taking: Reported on 05/08/2015) 60 tablet 3 Not Taking at Unknown time  . norethindrone (MICRONOR,CAMILA,ERRIN) 0.35 MG tablet Take 1 tablet (0.35 mg total) by mouth daily. (Patient not taking: Reported on 05/05/2015) 1 Package 2 Not Taking at Unknown time    Review of Systems  Constitutional: Positive for malaise/fatigue.  Respiratory: Negative.  Cardiovascular: Negative.  Genitourinary: Negative.    Blood pressure 115/67, pulse 95, temperature 98.7 F (37.1 C), temperature source Axillary, resp. rate 18, height  (1.575 m), weight 173.728 kg (383 lb), last menstrual period 02/18/2015, SpO2 100 %. Physical Exam  Constitutional: She is oriented to person, place, and time. She appears well-developed. No distress.  obese  Neck: Normal range of motion.  Cardiovascular: Normal rate.  Respiratory: Effort normal. No respiratory distress.  Neurological: She is alert and oriented to person, place, and time.  Skin: Skin is warm and dry.  Psychiatric: She has a normal mood and affect. Her behavior is normal.     Lab Results Last 24 Hours    Results for orders placed or performed during the hospital encounter of 05/08/15 (from the past 24 hour(s))  Prepare RBC Status: None   Collection Time: 05/08/15 12:00 PM  Result Value Ref Range   Order Confirmation ORDER PROCESSED BY BLOOD BANK   ABO/Rh Status: None   Collection Time: 05/08/15 12:07  PM  Result Value Ref Range   ABO/RH(D) A NEG   Type and screen Status: None (Preliminary result)   Collection Time: 05/08/15 12:18 PM  Result Value Ref Range   ABO/RH(D) A NEG    Antibody Screen NEG    Sample Expiration 05/11/2015    Unit Number [id number]    Blood Component Type RED CELLS,LR    Unit division 00    Status of Unit ALLOCATED    Transfusion Status OK TO TRANSFUSE    Crossmatch Result Compatible    Unit Number [id number]    Blood Component Type RED CELLS,LR    Unit division 00    Status of Unit ISSUED    Transfusion Status OK TO TRANSFUSE    Crossmatch Result Compatible    Unit Number [id number]    Blood Component Type RBC LR PHER2    Unit division 00    Status of Unit ALLOCATED    Transfusion Status OK TO TRANSFUSE    Crossmatch Result Compatible      CBC  Labs (Brief)       Component Value Date/Time   WBC 11.0* 05/05/2015  0849   RBC 3.19* 05/05/2015 0849   HGB 5.1* 05/05/2015 0849   HCT 20.1* 05/05/2015 0849   PLT 383 05/05/2015 0849   MCV 63.0* 05/05/2015 0849   MCH 16.0* 05/05/2015 0849   MCHC 25.4* 05/05/2015 0849   RDW 24.3* 05/05/2015 0849   LYMPHSABS 3.0 03/23/2015 1143   MONOABS 0.6 03/23/2015 1143   EOSABS 0.3 03/23/2015 1143   BASOSABS 0.0 03/23/2015 1143      CLINICAL DATA: Morbid obesity, dysfunctional uterine bleeding for the past 2 months, anemia. Onset of last normal menstrual period on February 19, 2015 with persistent bleeding.  EXAM: TRANSABDOMINAL AND TRANSVAGINAL ULTRASOUND OF PELVIS  TECHNIQUE: Both transabdominal and transvaginal ultrasound examinations of the pelvis were performed. Transabdominal technique was performed for global imaging of the pelvis including uterus, ovaries, adnexal regions, and pelvic cul-de-sac. It was necessary to proceed  with endovaginal exam following the transabdominal exam to visualize the uterus and ovaries.  COMPARISON: 8.3 x 4.0 x 4.2 cm  FINDINGS: Uterus  Measurements: 8.3 x 4.0 x 4.2 cm. The uterine myometrial echotexture is normal. There are no discrete fibroids.  Endometrium  Thickness: 14.1 mm in thickness. The patient is activelybleeding with motion within the endometrium observed.  Right ovary  Measurements: 3.2 x 2.0 x 2.5 cm. Normal appearance/no adnexal mass.  Left ovary  Measurements: 3.1 x 2.3 x 2.5 cm. Normal appearance/no adnexal mass.  Other findings  There is no free pelvic fluid.  IMPRESSION: 1. The patient is actively bleeding. The endometrium is thickened to 14.1 mm. No fibroids are observed. If bleeding remains unresponsive to hormonal or medical therapy, focal lesion work-up with sonohysterogram should be considered. Endometrial biopsy should also be considered in pre-menopausal patients at high risk for endometrial carcinoma. (Ref: Radiological Reasoning: Algorithmic Workup of Abnormal Vaginal Bleeding with Endovaginal Sonography and Sonohysterography. AJR 2008; 161:W96-04) 2. The ovaries are unremarkable. No adnexal masses are observed. There is no free pelvic fluid.   Electronically Signed  By: David Swaziland M.D.  On: 04/19/2015 10:09  Assessment/Plan: DUB- responded to Megace as Rx Severe anemia secondary to vaginal bleeding-for transfusion 3 units PRBC today  Mallarie Voorhies 05/08/2015, 7:17 PM       Consults: None  Significant Diagnostic Studies: labs:  CBC    Component Value Date/Time   WBC 11.0* 05/05/2015 0849   RBC 3.19* 05/05/2015 0849   HGB 7.2* 05/09/2015 0455   HCT 25.3* 05/09/2015 0455   PLT 383 05/05/2015 0849   MCV 63.0* 05/05/2015 0849   MCH 16.0* 05/05/2015 0849   MCHC 25.4* 05/05/2015 0849   RDW 24.3* 05/05/2015 0849   LYMPHSABS 3.0 03/23/2015 1143   MONOABS 0.6 03/23/2015 1143   EOSABS 0.3 03/23/2015  1143   BASOSABS 0.0 03/23/2015 1143      Treatments: IV hydration and transfusion 3 units PRBC  Discharge Exam: Blood pressure 106/49, pulse 78, temperature 98.5 F (36.9 C), temperature source Oral, resp. rate 18, height 5\' 2"  (1.575 m), weight 173.728 kg (383 lb), last menstrual period 02/18/2015, SpO2 100 %. General appearance: alert, cooperative and no distress No vaginal bleeding, ambulating with no problem Disposition: 01-Home or Self Care     Medication List    STOP taking these medications        norethindrone 0.35 MG tablet  Commonly known as:  MICRONOR,CAMILA,ERRIN      TAKE these medications        acetaminophen 500 MG tablet  Commonly known as:  TYLENOL  Take 1,000 mg by mouth  every 6 (six) hours as needed for moderate pain.     albuterol (2.5 MG/3ML) 0.083% nebulizer solution  Commonly known as:  PROVENTIL  Take 3 mLs (2.5 mg total) by nebulization every 4 (four) hours as needed for wheezing or shortness of breath.     albuterol 108 (90 BASE) MCG/ACT inhaler  Commonly known as:  PROVENTIL HFA;VENTOLIN HFA  Inhale 2 puffs into the lungs every 6 (six) hours as needed for wheezing or shortness of breath.     cetirizine 10 MG tablet  Commonly known as:  ZYRTEC  Take 10 mg by mouth daily as needed for allergies.     ferrous sulfate 300 (60 FE) MG/5ML syrup  Take 5 mLs (300 mg total) by mouth 3 (three) times daily with meals.     ibuprofen 200 MG tablet  Commonly known as:  ADVIL,MOTRIN  Take 800 mg by mouth every 6 (six) hours as needed for moderate pain.     megestrol 40 MG tablet  Commonly known as:  MEGACE  Take 1 tablet (40 mg total) by mouth daily. May take up to 3 tablets per day     metFORMIN 500 MG tablet  Commonly known as:  GLUCOPHAGE  Take 1 tablet (500 mg total) by mouth daily after supper.     multivitamin with minerals Tabs tablet  Take 1 tablet by mouth daily.     traMADol 50 MG tablet  Commonly known as:  ULTRAM  Take 1 tablet (50  mg total) by mouth every 8 (eight) hours as needed.     VITAMIN B 12 PO  Take 1 tablet by mouth daily.           Follow-up Information    [facility] weeks.   Specialty:  Obstetrics and Gynecology   Contact information:   [address] [place] [id number] [phone]      Signed: Scheryl DarterRNOLD,Elouise Divelbiss 05/09/2015, 6:24 AM

## 2015-05-09 NOTE — Discharge Instructions (Signed)

## 2015-05-09 NOTE — Progress Notes (Signed)
Pt ambulated out teaching complete  

## 2015-08-02 ENCOUNTER — Encounter: Payer: Self-pay | Admitting: *Deleted

## 2015-08-28 ENCOUNTER — Encounter (HOSPITAL_BASED_OUTPATIENT_CLINIC_OR_DEPARTMENT_OTHER): Payer: Self-pay | Admitting: *Deleted

## 2015-08-28 DIAGNOSIS — Z8701 Personal history of pneumonia (recurrent): Secondary | ICD-10-CM | POA: Insufficient documentation

## 2015-08-28 DIAGNOSIS — D649 Anemia, unspecified: Secondary | ICD-10-CM | POA: Insufficient documentation

## 2015-08-28 DIAGNOSIS — N390 Urinary tract infection, site not specified: Secondary | ICD-10-CM | POA: Insufficient documentation

## 2015-08-28 DIAGNOSIS — A084 Viral intestinal infection, unspecified: Secondary | ICD-10-CM | POA: Insufficient documentation

## 2015-08-28 DIAGNOSIS — Z3202 Encounter for pregnancy test, result negative: Secondary | ICD-10-CM | POA: Insufficient documentation

## 2015-08-28 DIAGNOSIS — J45909 Unspecified asthma, uncomplicated: Secondary | ICD-10-CM | POA: Insufficient documentation

## 2015-08-28 DIAGNOSIS — F17210 Nicotine dependence, cigarettes, uncomplicated: Secondary | ICD-10-CM | POA: Insufficient documentation

## 2015-08-28 DIAGNOSIS — Z79899 Other long term (current) drug therapy: Secondary | ICD-10-CM | POA: Insufficient documentation

## 2015-08-28 NOTE — ED Notes (Signed)
Vomiting and diarrhea since this am

## 2015-08-29 ENCOUNTER — Emergency Department (HOSPITAL_BASED_OUTPATIENT_CLINIC_OR_DEPARTMENT_OTHER)
Admission: EM | Admit: 2015-08-29 | Discharge: 2015-08-29 | Disposition: A | Discharge status: Home / Self Care | Payer: Self-pay | Attending: Emergency Medicine | Admitting: Emergency Medicine

## 2015-08-29 DIAGNOSIS — N390 Urinary tract infection, site not specified: Secondary | ICD-10-CM

## 2015-08-29 DIAGNOSIS — A084 Viral intestinal infection, unspecified: Secondary | ICD-10-CM

## 2015-08-29 LAB — URINALYSIS, ROUTINE W REFLEX MICROSCOPIC
Glucose, UA: NEGATIVE mg/dL
KETONES UR: NEGATIVE mg/dL
NITRITE: NEGATIVE
PROTEIN: 30 mg/dL — AB
SPECIFIC GRAVITY, URINE: 1.03 (ref 1.005–1.030)
pH: 6 (ref 5.0–8.0)

## 2015-08-29 LAB — URINE MICROSCOPIC-ADD ON

## 2015-08-29 LAB — PREGNANCY, URINE: PREG TEST UR: NEGATIVE

## 2015-08-29 MED ORDER — PROMETHAZINE HCL 25 MG PO TABS: 25.0000 mg | ORAL_TABLET | Freq: Four times a day (QID) | ORAL | Status: AC | PRN

## 2015-08-29 MED ORDER — NITROFURANTOIN MONOHYD MACRO 100 MG PO CAPS: 100.0000 mg | ORAL_CAPSULE | Freq: Two times a day (BID) | ORAL | Status: AC

## 2015-08-29 MED ORDER — ONDANSETRON 8 MG PO TBDP
8.0000 mg | ORAL_TABLET | Freq: Once | ORAL | Status: AC
  Administered 2015-08-29: 8 mg via ORAL
  Filled 2015-08-29: qty 1

## 2015-08-29 MED ORDER — NITROFURANTOIN MONOHYD MACRO 100 MG PO CAPS
100.0000 mg | ORAL_CAPSULE | Freq: Once | ORAL | Status: DC
  Filled 2015-08-29: qty 1

## 2015-08-29 MED FILL — ?NITROFURANTOIN-MACRO 100 M: 100 | 7 days supply | Qty: 14 | Fill #0

## 2015-08-29 MED FILL — PROMETHAZINE 25 MG TABLET: 25 | 3 days supply | Qty: 10 | Fill #0

## 2015-08-29 NOTE — ED Notes (Signed)
Pt tolerating PO fluids and urinating without issue.

## 2015-08-29 NOTE — ED Notes (Signed)
Pt verbalizes understanding of d/c instructions and denies any further needs at this time. 

## 2015-08-29 NOTE — ED Provider Notes (Signed)
CSN: [id number]     Arrival date & time 08/28/15  2136 History   First MD Initiated Contact with Patient 08/29/15 0143     Chief Complaint  Patient presents with  . Vomiting      (Consider location/radiation/quality/duration/timing/severity/associated sxs/prior Treatment) HPI  This is a 30 year old female with about a 24-hour history of nausea, vomiting and diarrhea. She has vomited multiple times. Any attempt to drink causes her to vomit. She denies abdominal pain. She did have subjective fever yesterday. Her husband is sick with a similar illness. She feels generally weak. She is having some mild burning with urination but does not believe she is on her menses.   Past Medical History  Diagnosis Date  . Asthma     uses inhaler  . Anemia   . History of blood transfusion   . Pneumonia     at [facility] long  jan 2016   Past Surgical History  Procedure Laterality Date  . Tonsillectomy    . Cholecystectomy     Family History  Problem Relation Age of Onset  . Asthma Mother   . Hypertension Mother   . Asthma Father   . Diabetes Mellitus II Father   . Cancer Father    Social History  Substance Use Topics  . Smoking status: Current Every Day Smoker -- 0.30 packs/day    Types: Cigarettes  . Smokeless tobacco: None  . Alcohol Use: No   OB History    No data available     Review of Systems  All other systems reviewed and are negative.   Allergies  Aloe vera; Ceclor; and Codeine  Home Medications   Prior to Admission medications   Medication Sig Start Date End Date Taking? Authorizing Provider  acetaminophen (TYLENOL) 500 MG tablet Take 1,000 mg by mouth every 6 (six) hours as needed for moderate pain.    Historical Provider, MD  albuterol (PROVENTIL HFA;VENTOLIN HFA) 108 (90 BASE) MCG/ACT inhaler Inhale 2 puffs into the lungs every 6 (six) hours as needed for wheezing or shortness of breath. 07/20/14   Quentin Angst, MD  albuterol (PROVENTIL) (2.5 MG/3ML) 0.083%  nebulizer solution Take 3 mLs (2.5 mg total) by nebulization every 4 (four) hours as needed for wheezing or shortness of breath. 06/08/14   Dorothea Ogle, MD  cetirizine (ZYRTEC) 10 MG tablet Take 10 mg by mouth daily as needed for allergies.    Historical Provider, MD  Cyanocobalamin (VITAMIN B 12 PO) Take 1 tablet by mouth daily.    Historical Provider, MD  ferrous sulfate 300 (60 FE) MG/5ML syrup Take 5 mLs (300 mg total) by mouth 3 (three) times daily with meals. 03/30/15   Ambrose Finland, NP  ibuprofen (ADVIL,MOTRIN) 200 MG tablet Take 800 mg by mouth every 6 (six) hours as needed for moderate pain.    Historical Provider, MD  megestrol (MEGACE) 40 MG tablet Take 1 tablet (40 mg total) by mouth daily. May take up to 3 tablets per day 05/05/15   Catalina Antigua, MD  metFORMIN (GLUCOPHAGE) 500 MG tablet Take 1 tablet (500 mg total) by mouth daily after supper. Patient not taking: Reported on 05/08/2015 03/21/15   Ambrose Finland, NP  Multiple Vitamin (MULTIVITAMIN WITH MINERALS) TABS tablet Take 1 tablet by mouth daily.    Historical Provider, MD  traMADol (ULTRAM) 50 MG tablet Take 1 tablet (50 mg total) by mouth every 8 (eight) hours as needed. Patient taking differently: Take 50 mg by mouth every  8 (eight) hours as needed for moderate pain.  03/21/15   Ambrose FinlandValerie A Keck, NP   BP 139/89 mmHg  Pulse 91  Temp(Src) 98.5 F (36.9 C) (Oral)  Resp 18  Ht 5' 4.5" (1.638 m)  Wt 355 lb (161.027 kg)  BMI 60.02 kg/m2  SpO2 100%   Physical Exam General: Well-developed, morbidly obese female in no acute distress; appearance consistent with age of record HENT: normocephalic; atraumatic Eyes: pupils equal, round and reactive to light; extraocular muscles intact Neck: supple Heart: regular rate and rhythm; no murmurs, rubs or gallops Lungs: clear to auscultation bilaterally Abdomen: soft; nondistended; nontender; no masses or hepatosplenomegaly; bowel sounds present Extremities: No deformity; full range of  motion; pulses normal Neurologic: Awake, alert and oriented; motor function intact in all extremities and symmetric; no facial droop Skin: Warm and dry Psychiatric: Normal mood and affect  ED Course  Procedures (including critical care time)   MDM   Nursing notes and vitals signs, including pulse oximetry, reviewed.  Summary of this visit's results, reviewed by myself:  Labs:  Results for orders placed or performed during the hospital encounter of 08/29/15 (from the past 24 hour(s))  Pregnancy, urine     Status: None   Collection Time: 08/29/15 12:19 AM  Result Value Ref Range   Preg Test, Ur NEGATIVE NEGATIVE  Urinalysis, Routine w reflex microscopic ([facility])     Status: Abnormal   Collection Time: 08/29/15 12:19 AM  Result Value Ref Range   Color, Urine AMBER (A) YELLOW   APPearance CLOUDY (A) CLEAR   Specific Gravity, Urine 1.030 1.005 - 1.030   pH 6.0 5.0 - 8.0   Glucose, UA NEGATIVE NEGATIVE mg/dL   Hgb urine dipstick LARGE (A) NEGATIVE   Bilirubin Urine SMALL (A) NEGATIVE   Ketones, ur NEGATIVE NEGATIVE mg/dL   Protein, ur 30 (A) NEGATIVE mg/dL   Nitrite NEGATIVE NEGATIVE   Leukocytes, UA SMALL (A) NEGATIVE  Urine microscopic-add on     Status: Abnormal   Collection Time: 08/29/15 12:19 AM  Result Value Ref Range   Squamous Epithelial / LPF 6-30 (A) NONE SEEN   WBC, UA 6-30 0 - 5 WBC/hpf   RBC / HPF 6-30 0 - 5 RBC/hpf   Bacteria, UA FEW (A) NONE SEEN   2:54 AM Drinking fluids without emesis after IV fluid bolus and Zofran. We'll treat for acute urinary tract infection.    Paula LibraJohn Bryla Burek, MD 08/29/15 [phone]

## 2015-08-29 NOTE — ED Notes (Signed)
Pt has empty sprite and fruit snacks in the trash can without evidence of emesis

## 2015-08-29 NOTE — ED Notes (Signed)
Pt c/o n/v/d since this morning.  Her [facility] yesterday and treated for the stomach flu.  He is also here for treatment.  Pt states the last time she vomited was at 1800 tonight.

## 2015-08-30 LAB — URINE CULTURE

## 2015-09-23 IMAGING — CT CT ANGIO CHEST
2 of 6 series · 19 of 36 positions shown · IV contrast (OMNIPAQUE)
Comparison: Chest radiograph of June 05, 2013.

CLINICAL DATA: Shortness of breath, chest pain.

EXAM:
CT ANGIOGRAPHY CHEST WITH CONTRAST
TECHNIQUE: Multidetector CT imaging of the chest was performed using the
standard protocol during bolus administration of intravenous
contrast. Multiplanar CT image reconstructions and MIPs were
obtained to evaluate the vascular anatomy.
CONTRAST:  100mL OMNIPAQUE IOHEXOL 350 MG/ML SOLN

[Series 7: pe thins @ 1mm · axial · 0.66mm/px · z∈[-315,-95]mm · 18 of 246 slices shown]
[im 13/246  lung]
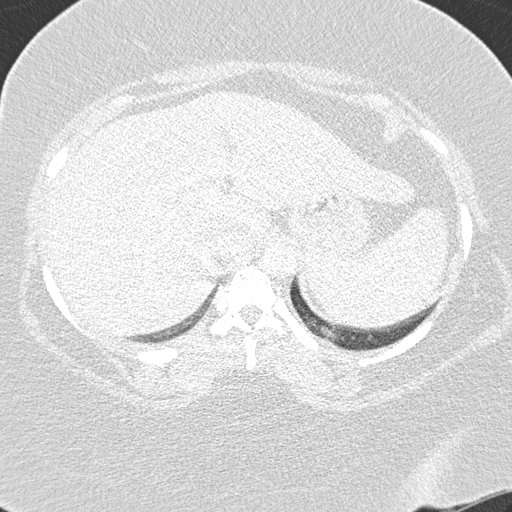
[im 25/246  mediastinal]
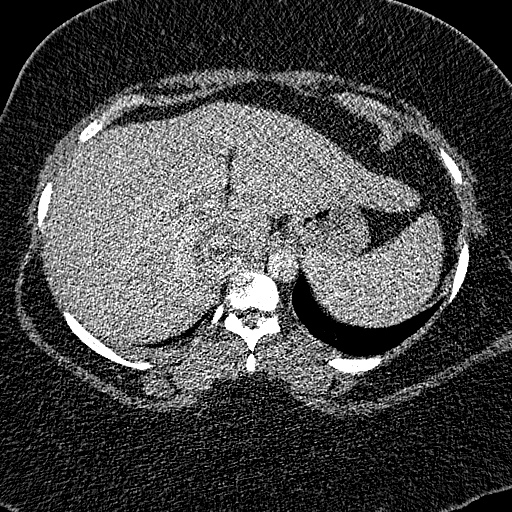
[im 37/246  lung]
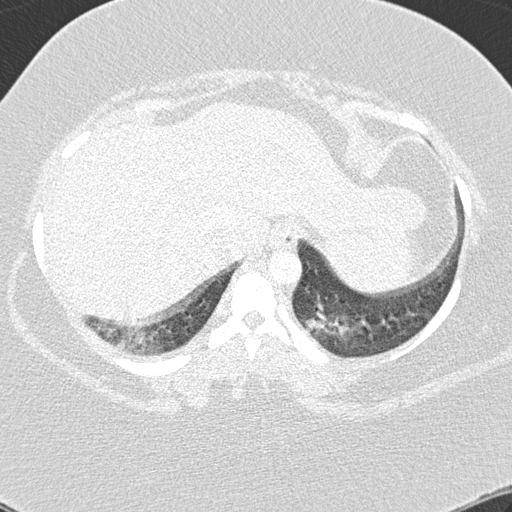
[im 50/246  mediastinal]
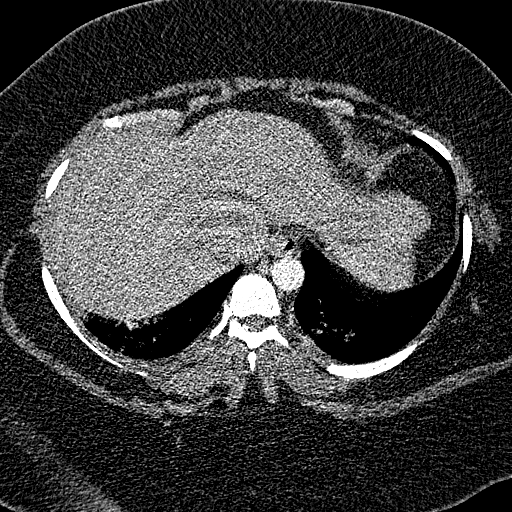
[im 62/246  lung]
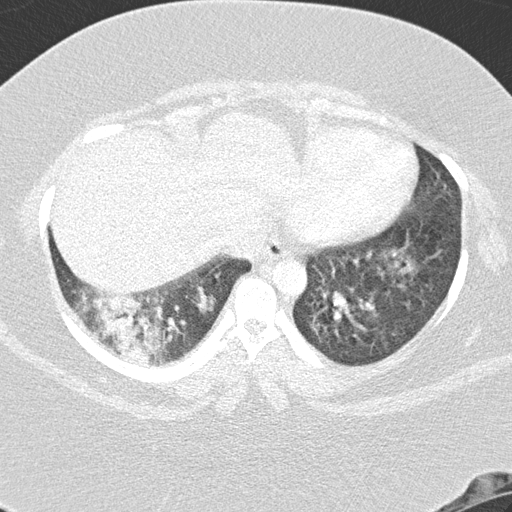
[im 74/246  mediastinal]
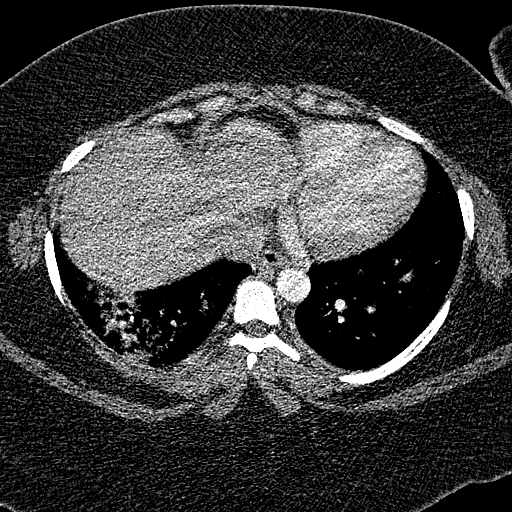
[im 86/246  lung]
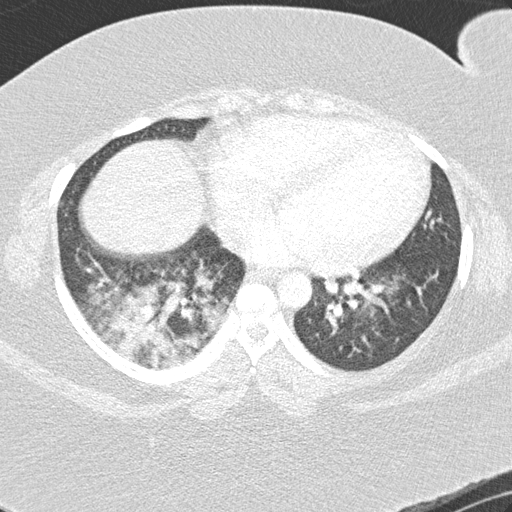
[im 99/246  mediastinal]
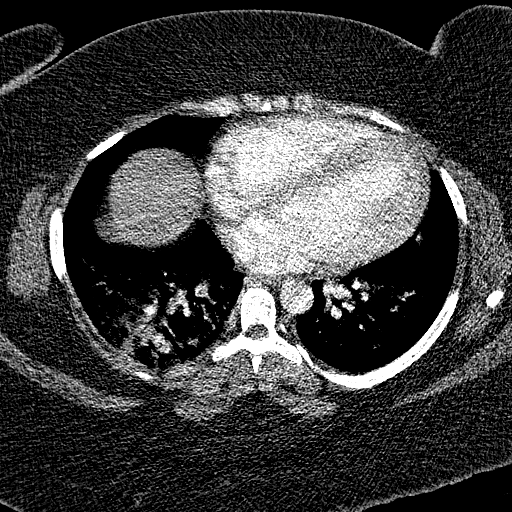
[im 111/246  lung]
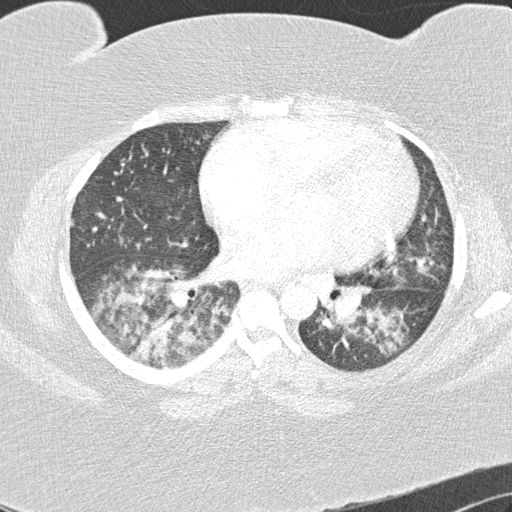
[im 135/246  mediastinal]
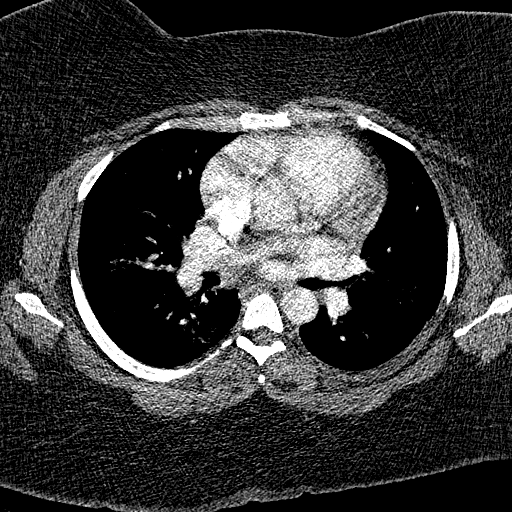
[im 148/246  lung]
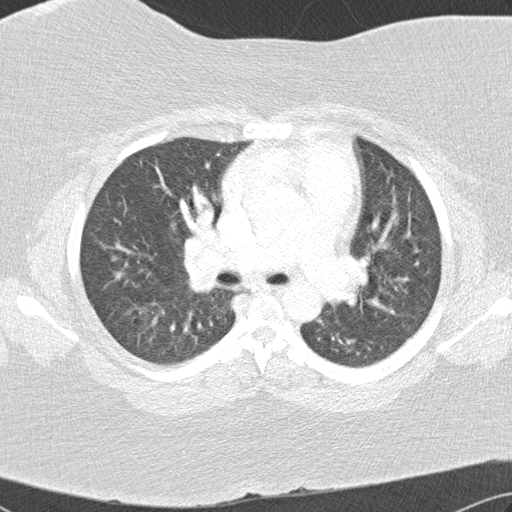
[im 160/246  mediastinal]
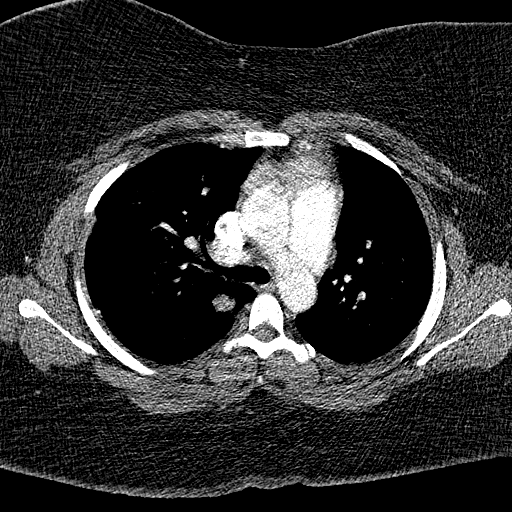
[im 172/246  lung]
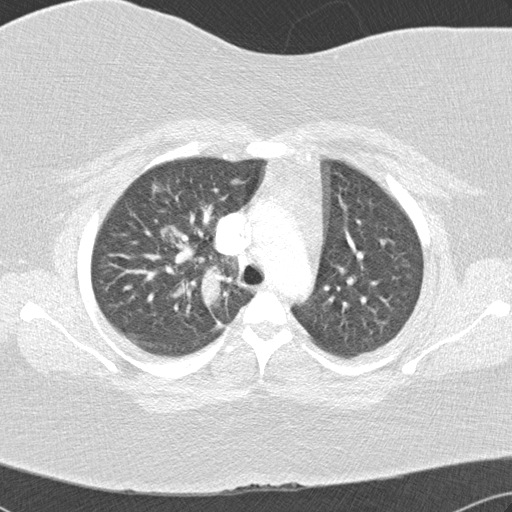
[im 184/246  mediastinal]
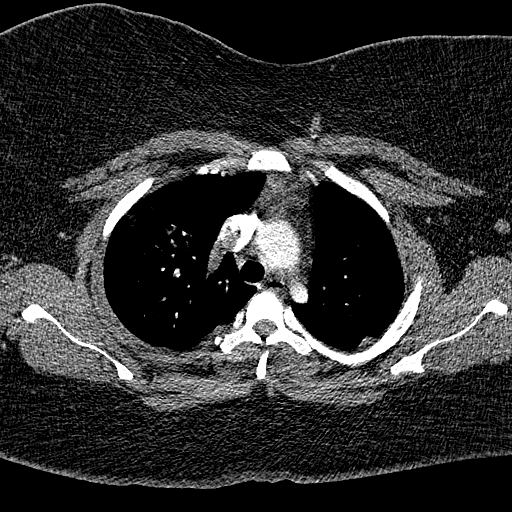
[im 197/246  lung]
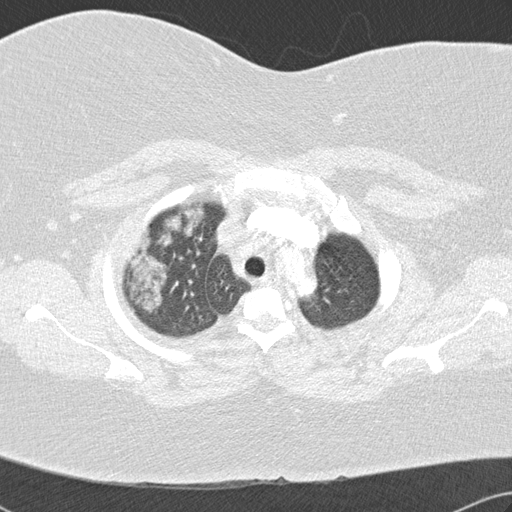
[im 209/246  mediastinal]
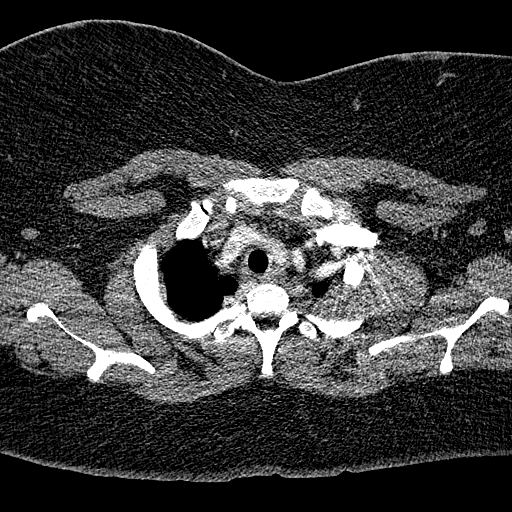
[im 221/246  lung]
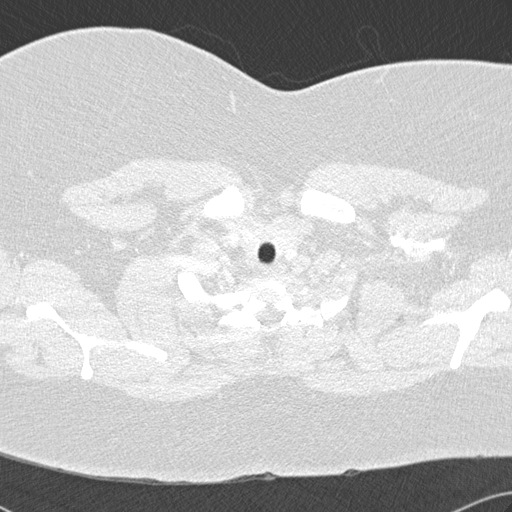
[im 233/246  mediastinal]
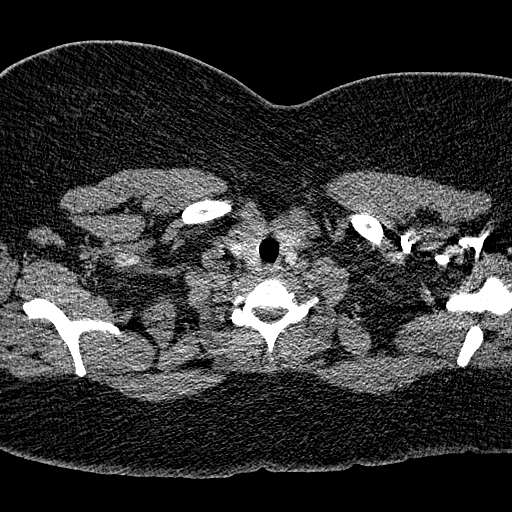

[Series 602: <mpr thick range> · coronal · 0.66mm/px · 1 of 117 slices shown]
[im 59/117  mediastinal]
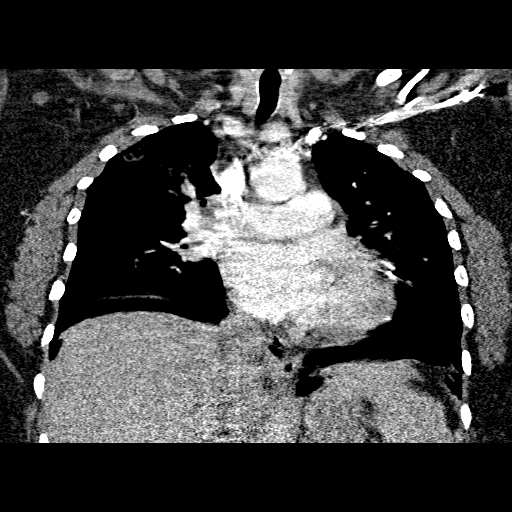

[19 of 36 positions shown; findings below may reference images not displayed]

FINDINGS: No pneumothorax or pleural effusion is noted. Airspace opacity is
noted in both lower lobes, with right greater than left as well as
airspace opacity seen in the right upper lobe. These findings are
most consistent with multifocal pneumonia. There is no evidence of
thoracic aortic dissection or aneurysm. There does not appear to be
any definite evidence of large central pulmonary embolus. However,
due to respiratory motion artifact and soft tissue attenuation due
to body habitus, smaller peripheral pulmonary emboli cannot be
excluded on the basis of this exam. No mediastinal mass or
adenopathy is noted. Visualized portion of upper abdomen appears
normal.

Review of the MIP images confirms the above findings.
IMPRESSION: Multifocal pneumonia is noted, most prominently seen in the right
lower lobe.

There appears to be no definite evidence of large central pulmonary
embolus. However, this exam is significantly limited due to
attenuation to body habitus as well as respiratory motion artifact,
and therefore smaller peripheral pulmonary emboli cannot be excluded
on the basis of this exam.

## 2016-08-05 IMAGING — US US PELVIS COMPLETE
1 series · 15 of 25 positions shown · non-contrast
Comparison: [DATE] x 4.0 x 4.2 cm

CLINICAL DATA: Morbid obesity, dysfunctional uterine bleeding for
the past 2 months, anemia. Onset of last normal menstrual period on
February 19, 2015 with persistent bleeding.

EXAM:
TRANSABDOMINAL AND TRANSVAGINAL ULTRASOUND OF PELVIS
TECHNIQUE: Both transabdominal and transvaginal ultrasound examinations of the
pelvis were performed. Transabdominal technique was performed for
global imaging of the pelvis including uterus, ovaries, adnexal
regions, and pelvic cul-de-sac. It was necessary to proceed with
endovaginal exam following the transabdominal exam to visualize the
uterus and ovaries.

[Series 1: us pelvis complete · 15 of 102 slices shown]
[im 1/102]
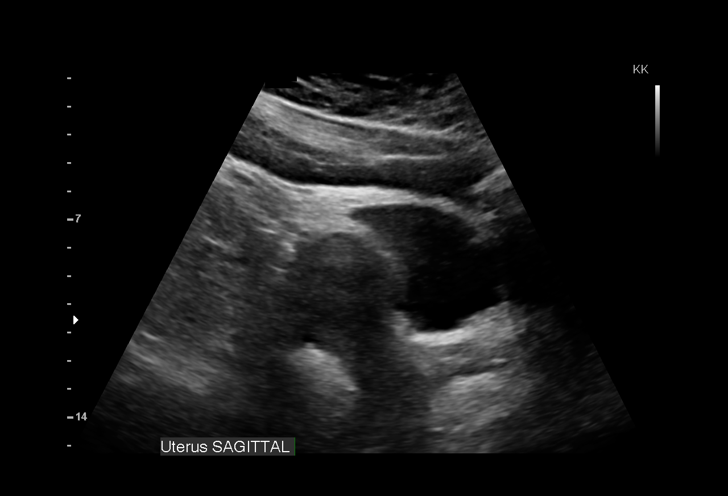
[im 9/102]
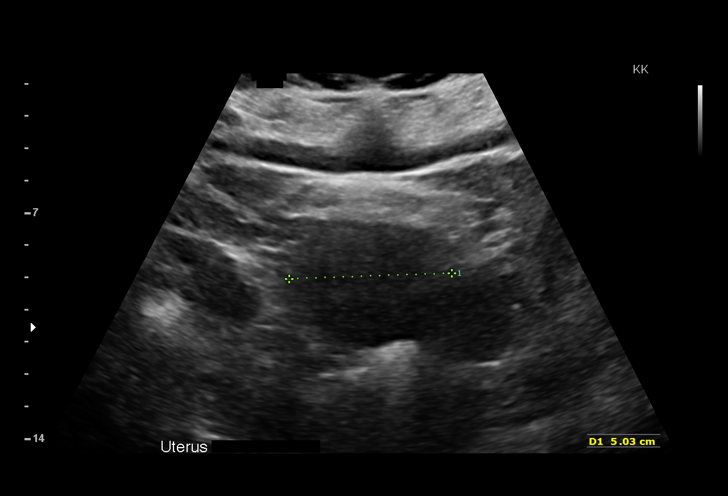
[im 17/102]
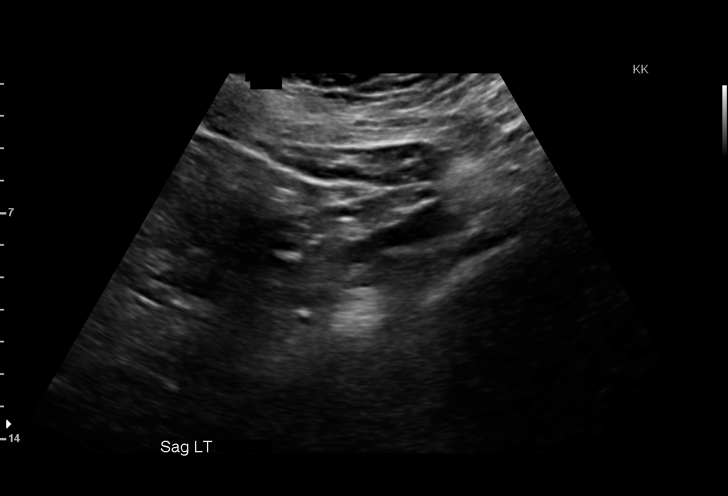
[im 22/102]
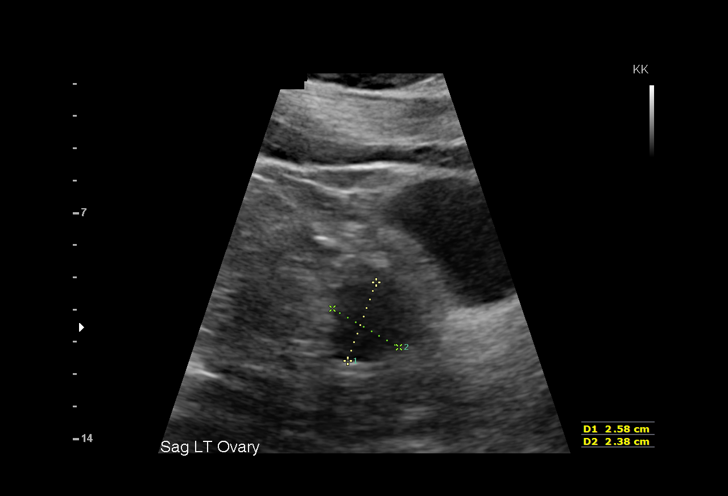
[im 30/102]
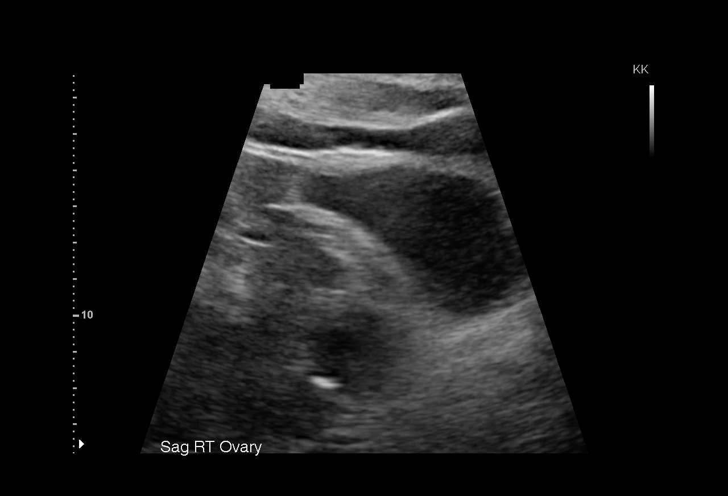
[im 38/102]
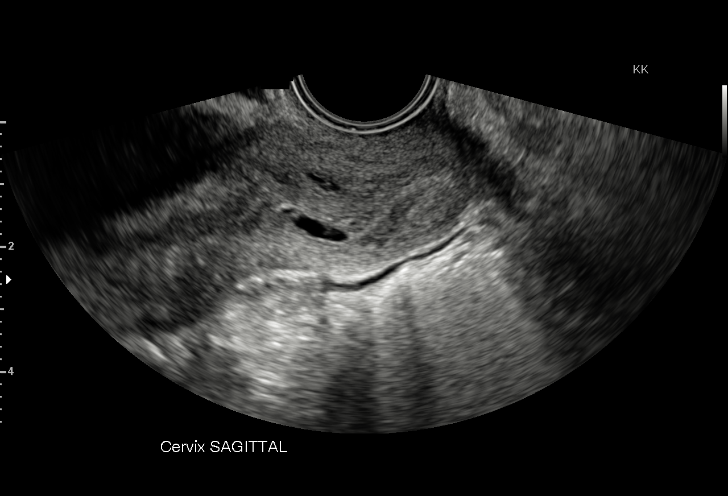
[im 43/102]
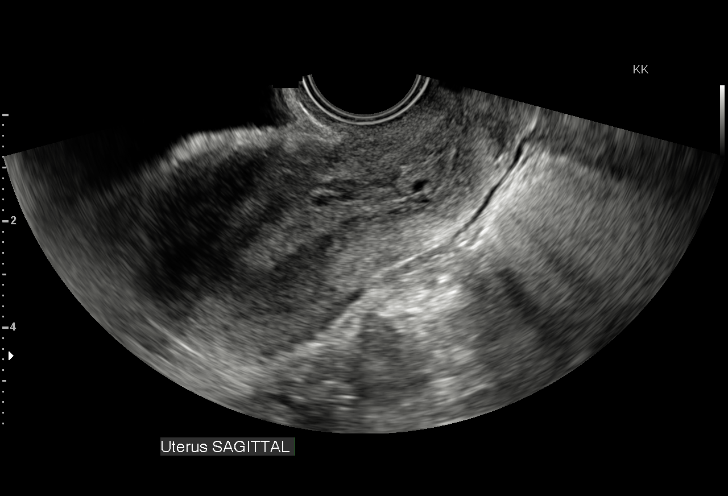
[im 51/102]
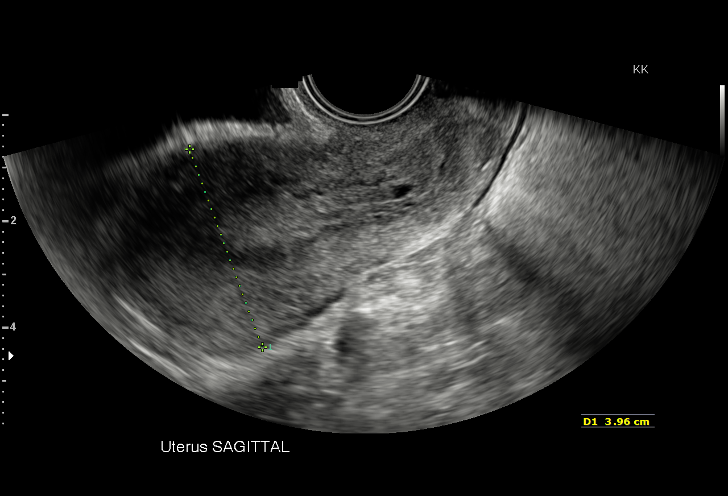
[im 59/102]
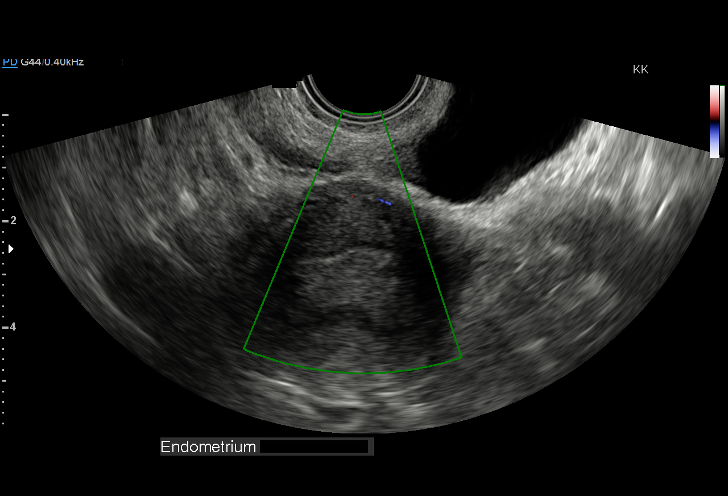
[im 64/102]
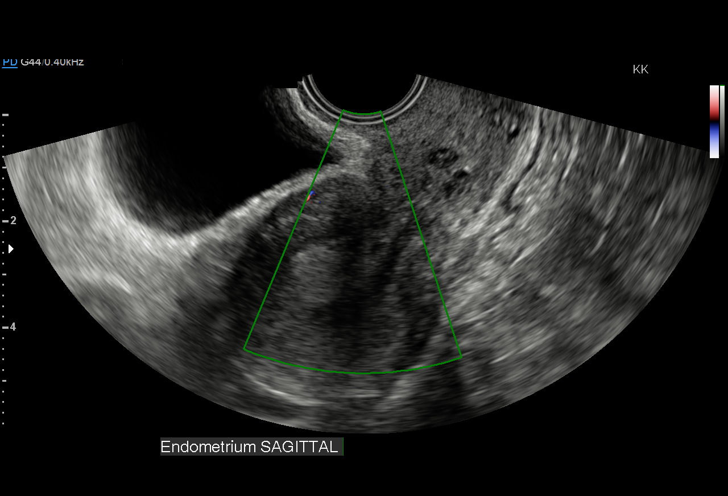
[im 72/102]
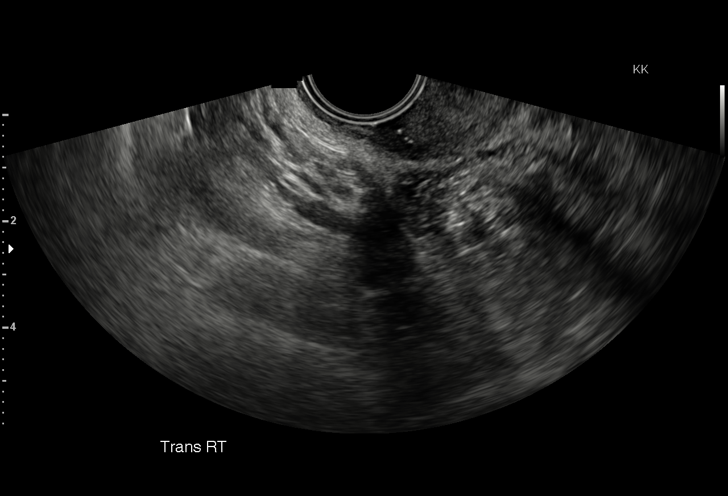
[im 80/102]
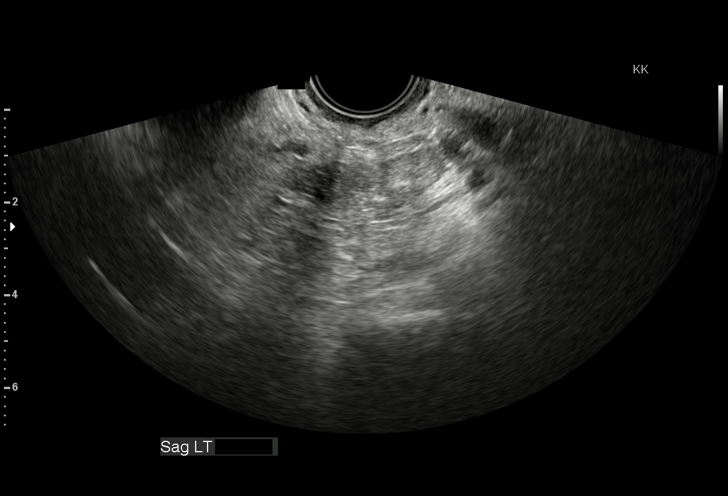
[im 85/102]
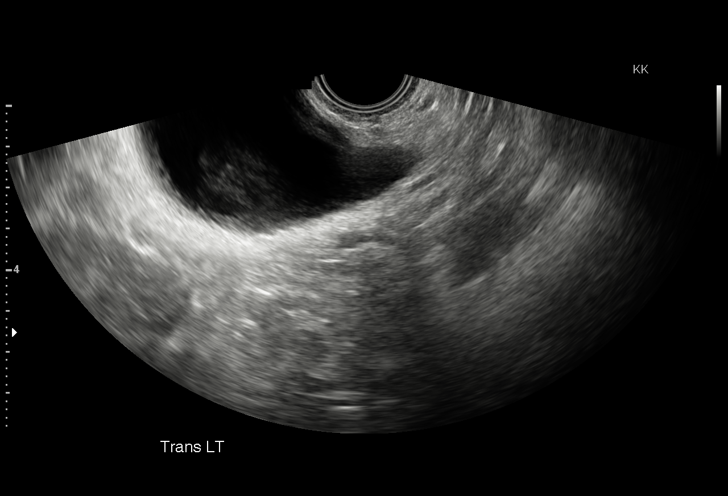
[im 93/102]
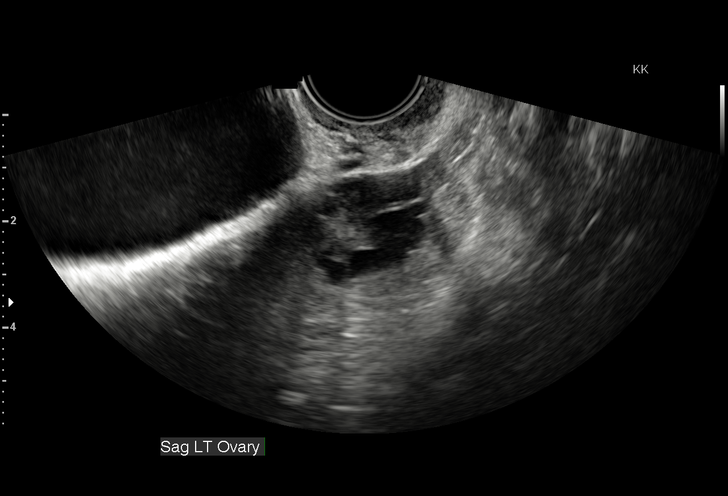
[im 102/102]
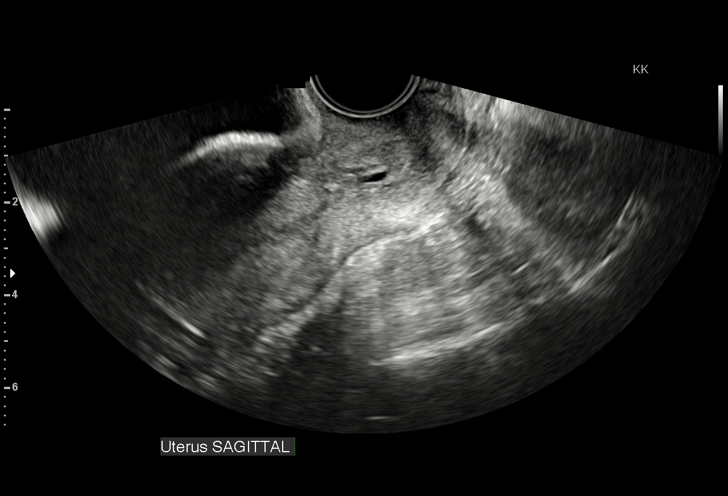

[15 of 25 positions shown; findings below may reference images not displayed]

FINDINGS: Uterus

Measurements: 8.3 x 4.0 x 4.2 cm. The uterine myometrial echotexture
is normal. There are no discrete fibroids.

Endometrium

Thickness: 14.1 mm in thickness. The patient is activelybleeding
with motion within the endometrium observed.

Right ovary

Measurements: 3.2 x 2.0 x 2.5 cm. Normal appearance/no adnexal mass.

Left ovary

Measurements: 3.1 x 2.3 x 2.5 cm. Normal appearance/no adnexal mass.

Other findings

There is no free pelvic fluid.
IMPRESSION: 1. The patient is actively bleeding. The endometrium is thickened to
14.1 mm. No fibroids are observed. If bleeding remains unresponsive
to hormonal or medical therapy, focal lesion work-up with
sonohysterogram should be considered. Endometrial biopsy should also
be considered in pre-menopausal patients at high risk for
endometrial carcinoma. (Ref: Radiological Reasoning: Algorithmic
Workup of Abnormal Vaginal Bleeding with Endovaginal Sonography and
Sonohysterography. AJR [id number]; 191:S68-73)
2. The ovaries are unremarkable. No adnexal masses are observed.
There is no free pelvic fluid.
